# Patient Record
Sex: Female | Born: 1980 | Race: White | Hispanic: No | Marital: Married | Smoking: Never smoker
Health system: Southern US, Community
[De-identification: ages and names within clinical notes are randomized; demographics above are authoritative.]

## PROBLEM LIST (undated history)

## (undated) DIAGNOSIS — J45909 Unspecified asthma, uncomplicated: Secondary | ICD-10-CM

## (undated) HISTORY — PX: FOOT SURGERY: SHX648

---

## 2005-06-06 ENCOUNTER — Observation Stay: Payer: Self-pay | Admitting: Obstetrics and Gynecology

## 2005-06-07 ENCOUNTER — Ambulatory Visit: Payer: Self-pay | Admitting: Obstetrics and Gynecology

## 2005-06-08 ENCOUNTER — Ambulatory Visit: Payer: Self-pay | Admitting: Obstetrics and Gynecology

## 2005-06-20 ENCOUNTER — Observation Stay: Payer: Self-pay | Admitting: Obstetrics and Gynecology

## 2005-06-28 ENCOUNTER — Observation Stay: Payer: Self-pay | Admitting: Obstetrics and Gynecology

## 2005-07-01 ENCOUNTER — Inpatient Hospital Stay: Payer: Self-pay | Admitting: Obstetrics and Gynecology

## 2006-04-24 ENCOUNTER — Emergency Department: Payer: Self-pay | Admitting: Internal Medicine

## 2007-10-01 ENCOUNTER — Emergency Department: Payer: Self-pay | Admitting: Internal Medicine

## 2008-06-01 ENCOUNTER — Emergency Department: Payer: Self-pay | Admitting: Emergency Medicine

## 2009-01-20 ENCOUNTER — Ambulatory Visit: Payer: Self-pay | Admitting: Specialist

## 2009-02-06 ENCOUNTER — Ambulatory Visit: Payer: Self-pay | Admitting: Specialist

## 2009-04-08 ENCOUNTER — Ambulatory Visit: Payer: Self-pay | Admitting: Gastroenterology

## 2009-04-27 ENCOUNTER — Emergency Department: Payer: Self-pay | Admitting: Emergency Medicine

## 2009-07-29 ENCOUNTER — Emergency Department: Payer: Self-pay | Admitting: Emergency Medicine

## 2009-12-17 ENCOUNTER — Emergency Department: Payer: Self-pay | Admitting: Internal Medicine

## 2012-04-22 ENCOUNTER — Observation Stay: Payer: Self-pay | Admitting: Obstetrics and Gynecology

## 2012-04-22 LAB — URINALYSIS, COMPLETE
Bilirubin,UR: NEGATIVE
Blood: NEGATIVE
Glucose,UR: NEGATIVE mg/dL (ref 0–75)
Nitrite: NEGATIVE
Ph: 7 (ref 4.5–8.0)
Protein: NEGATIVE
RBC,UR: 1 /HPF (ref 0–5)
Specific Gravity: 1.012 (ref 1.003–1.030)
Squamous Epithelial: 3
WBC UR: 1 /HPF (ref 0–5)

## 2012-04-23 ENCOUNTER — Ambulatory Visit: Payer: Self-pay | Admitting: Obstetrics and Gynecology

## 2012-04-23 LAB — FETAL FIBRONECTIN
Appearance: NORMAL
Fetal Fibronectin: NEGATIVE

## 2012-04-24 ENCOUNTER — Observation Stay: Payer: Self-pay

## 2012-04-24 LAB — URINE CULTURE

## 2012-04-25 ENCOUNTER — Observation Stay: Payer: Self-pay | Admitting: Obstetrics and Gynecology

## 2012-05-13 ENCOUNTER — Observation Stay: Payer: Self-pay

## 2012-05-13 LAB — URINALYSIS, COMPLETE
Bilirubin,UR: NEGATIVE
Blood: NEGATIVE
Glucose,UR: NEGATIVE mg/dL (ref 0–75)
Ketone: NEGATIVE
Nitrite: NEGATIVE
Ph: 7 (ref 4.5–8.0)
Protein: NEGATIVE
RBC,UR: NONE SEEN /HPF (ref 0–5)
Specific Gravity: 1.013 (ref 1.003–1.030)
Squamous Epithelial: NONE SEEN
WBC UR: NONE SEEN /HPF (ref 0–5)

## 2012-05-15 LAB — URINE CULTURE

## 2012-05-31 ENCOUNTER — Observation Stay: Payer: Self-pay

## 2012-06-11 ENCOUNTER — Observation Stay: Payer: Self-pay

## 2012-06-12 ENCOUNTER — Observation Stay: Payer: Self-pay

## 2012-06-19 ENCOUNTER — Inpatient Hospital Stay: Payer: Self-pay | Admitting: Obstetrics and Gynecology

## 2012-06-19 LAB — CBC WITH DIFFERENTIAL/PLATELET
Basophil #: 0 10*3/uL (ref 0.0–0.1)
Basophil %: 0.4 %
Eosinophil #: 0.1 10*3/uL (ref 0.0–0.7)
Eosinophil %: 0.9 %
HCT: 40.2 % (ref 35.0–47.0)
HGB: 13.8 g/dL (ref 12.0–16.0)
Lymphocyte #: 2.2 10*3/uL (ref 1.0–3.6)
Lymphocyte %: 19.2 %
MCH: 31.3 pg (ref 26.0–34.0)
MCHC: 34.2 g/dL (ref 32.0–36.0)
MCV: 92 fL (ref 80–100)
Monocyte #: 0.9 x10 3/mm (ref 0.2–0.9)
Monocyte %: 8.1 %
Neutrophil #: 8.1 10*3/uL — ABNORMAL HIGH (ref 1.4–6.5)
Neutrophil %: 71.4 %
Platelet: 204 10*3/uL (ref 150–440)
RBC: 4.4 10*6/uL (ref 3.80–5.20)
RDW: 13.6 % (ref 11.5–14.5)
WBC: 11.3 10*3/uL — ABNORMAL HIGH (ref 3.6–11.0)

## 2012-06-20 LAB — HEMATOCRIT: HCT: 34.6 % — ABNORMAL LOW (ref 35.0–47.0)

## 2012-06-20 LAB — PATHOLOGY REPORT

## 2014-01-16 ENCOUNTER — Ambulatory Visit: Payer: Self-pay | Admitting: Obstetrics and Gynecology

## 2014-05-16 ENCOUNTER — Ambulatory Visit: Payer: Self-pay | Admitting: Podiatry

## 2015-04-20 NOTE — Op Note (Signed)
PATIENT NAME:  Alexis Woodard, Alexis Woodard MR#:  [id number] DATE OF BIRTH:  25-May-1981  DATE OF PROCEDURE:  06/19/2012  PREOPERATIVE DIAGNOSES:  1. A 37 + 5 weeks estimated gestational age in active labor.  2. Prior obstetrical trauma.  3. Elective permanent sterilization.   POSTOPERATIVE DIAGNOSIS:  1. A 37 + 5 weeks estimated gestational age in active labor.  2. Prior obstetrical trauma.  3. Elective permanent sterilization.   PROCEDURE:  1. Primary low transverse cesarean section, elective.  2. Bilateral tubal ligation.  3. On-Q pump placement.   ANESTHESIA: Spinal.   SURGEON: Suzy Bouchardhomas J. Schermerhorn, MD   FIRST ASSISTANT: Street, Scrub Tech   INDICATIONS: A 34 year old, gravida 2, para 1 at 537 + 5 weeks estimated gestational age in active labor 4 cm dilated. The patient had previously desired a primary cesarean section for a prior third-degree laceration and elective sterilization.   PROCEDURE: After adequate spinal anesthesia, the patient was placed in the dorsal supine position with a hip roll under the right side. The patient's abdomen was prepped and draped in normal sterile fashion. A Pfannenstiel incision was made two fingerbreadths above the symphysis pubis. Sharp dissection was used to identify the fascia. The fascia was opened in the midline and opened in a transverse fashion. The superior aspect of the fascia was grasped with Kocher clamps and the recti muscles dissected free. The inferior aspect of the fascia was grasped with Kocher clamps and the pyramidalis muscle was dissected free. Entry into the peritoneal cavity was accomplished sharply. The vesicouterine peritoneal fold was identified and opened and the bladder was reflected inferiorly. A low transverse uterine incision was made. Upon entry into the endometrial cavity, clear fluid resulted. The incision was extended with blunt transverse traction. The fetal head was brought to the incision, and head, shoulders and body were delivered  without difficulty, a vigorous female. The cord was doubly clamped and passed to Dr. Abundio MiuHorowitz, who assigned Apgar scores of 9 and 9. The placenta was manually delivered, and the uterus was exteriorized. The endometrial cavity was wiped clean, and the cervix was opened with a ring forceps and this passed off the operative field. The uterine incision was closed with 1 chromic suture in a running locking fashion with good approximation of edges. Good hemostasis was noted. The right fallopian tube was then grasped with a Babcock clamp in the midportion of the fallopian tube. Two separate 0 plain gut sutures were applied and a 1.5 cm portion of the fallopian tube removed. Good hemostasis was noted. A similar procedure was repeated on the patient's left fallopian tube after grasping the fallopian tube at the midportion with a Babcock clamp, and again two separate 0 plain gut sutures were applied and a 1.5 cm portion of the fallopian tube removed. Good hemostasis was noted. The posterior cul-de-sac was irrigated and suctioned, and the uterus was placed back into the abdominal cavity. The paracolic gutters were then wiped clean with a laparotomy tape. The tubal ligation sites appeared hemostatic bilaterally, and the uterine incision appeared hemostatic. Interceed was placed over the uterine incision in a Woodard-shaped fashion. The superior aspect of the fascia was grasped with Kocher clamps, and the On-Q pump catheters were placed subfascially in standard fashion. The fascia was then closed over top the On-Q pump catheters with 0 Vicryl in a running nonlocking fashion. Subcutaneous tissues were irrigated and bovied, and the skin was reapproximated with staples. The On-Q pump catheters were then loaded with 5 mL of 0.5% Marcaine  in each catheter. A sterile dressing was applied over the pump site catheters, and the procedure was terminated. Estimated blood loss was 400 mL. Intraoperative fluids were 1800 mL. Urine output was 200  mL. The patient tolerated the procedure and was taken to the recovery room in good condition.    ____________________________ Suzy Bouchard, MD tjs:cbb D: 06/19/2012 10:41:26 ET Woodard: 06/19/2012 10:55:21 ET JOB#: [id number]  cc: Suzy Bouchard, MD, <Dictator> Suzy Bouchard MD ELECTRONICALLY SIGNED 06/20/2012 9:19

## 2015-05-06 NOTE — H&P (Signed)
L&D Evaluation:  History:   HPI 34yo G2P1001 with LMp of 9/19/112 & EDd of 06/22/11 & L 6 with EDD of 07/04/12 here for NST due to PTL S/S seen on Sat and recommended to come here on Monday am for f/u/ Pt had UC's q 10 mins over the weekend and now 3 per hour. Cx was 60-70/closed/vtx high. No ROM, Vb or decreased FM.    Presents with NST    Patient's Medical History 3rd degree lac, PTL 34 weeks, GERD    Patient's Surgical History 3rd degree lac, Rt cyst removed    Medications Pre Natal Vitamins    Allergies NKDA    Social History none    Family History Non-Contributory   ROS:   ROS All systems were reviewed.  HEENT, CNS, GI, GU, Respiratory, CV, Renal and Musculoskeletal systems were found to be normal.   Exam:   Vital Signs stable    General no apparent distress    Mental Status clear    Chest clear    Heart normal sinus rhythm    Abdomen gravid, non-tender    Estimated Fetal Weight Average for gestational age    Back no CVAT    Edema 1+    Reflexes 1+    Clonus negative    Mebranes Intact    FHT normal rate with no decels, reactive NST    Skin dry    Lymph no lymphadenopathy   Impression:   Impression IUP at 29 weeks with Th PTL   Plan:   Plan DC home on pelvic rest, bedrest for 2 days if UC's continue    Follow Up Appointment in 1 week. May need a cx US to evaluate cx   Electronic Signatures: Sharee PimpleJones, Caron W (CNM)  (Signed 29-Apr-13 09:23)  Authored: L&D Evaluation   Last Updated: 29-Apr-13 09:23 by Sharee PimpleJones, Caron W (CNM)

## 2016-11-11 ENCOUNTER — Encounter: Payer: Self-pay | Admitting: Emergency Medicine

## 2016-11-11 ENCOUNTER — Emergency Department: Payer: BC Managed Care – PPO

## 2016-11-11 ENCOUNTER — Emergency Department
Admission: EM | Admit: 2016-11-11 | Discharge: 2016-11-11 | Disposition: A | Discharge status: Home / Self Care | Payer: BC Managed Care – PPO | Attending: Student in an Organized Health Care Education/Training Program | Admitting: Student in an Organized Health Care Education/Training Program

## 2016-11-11 DIAGNOSIS — Z79899 Other long term (current) drug therapy: Secondary | ICD-10-CM | POA: Insufficient documentation

## 2016-11-11 DIAGNOSIS — R51 Headache: Secondary | ICD-10-CM | POA: Insufficient documentation

## 2016-11-11 DIAGNOSIS — R519 Headache, unspecified: Secondary | ICD-10-CM

## 2016-11-11 LAB — CBC WITH DIFFERENTIAL/PLATELET
Basophils Absolute: 0 10*3/uL (ref 0–0.1)
Basophils Relative: 0 %
Eosinophils Absolute: 0.1 10*3/uL (ref 0–0.7)
Eosinophils Relative: 1 %
HCT: 44.3 % (ref 35.0–47.0)
Hemoglobin: 14.4 g/dL (ref 12.0–16.0)
Lymphocytes Relative: 13 %
Lymphs Abs: 1.4 10*3/uL (ref 1.0–3.6)
MCH: 29 pg (ref 26.0–34.0)
MCHC: 32.6 g/dL (ref 32.0–36.0)
MCV: 89.1 fL (ref 80.0–100.0)
Monocytes Absolute: 0.4 10*3/uL (ref 0.2–0.9)
Monocytes Relative: 4 %
Neutro Abs: 8.4 10*3/uL — ABNORMAL HIGH (ref 1.4–6.5)
Neutrophils Relative %: 82 %
Platelets: 266 10*3/uL (ref 150–440)
RBC: 4.97 MIL/uL (ref 3.80–5.20)
RDW: 13.4 % (ref 11.5–14.5)
WBC: 10.3 10*3/uL (ref 3.6–11.0)

## 2016-11-11 LAB — COMPREHENSIVE METABOLIC PANEL
ALT: 39 U/L (ref 14–54)
AST: 32 U/L (ref 15–41)
Albumin: 3.7 g/dL (ref 3.5–5.0)
Alkaline Phosphatase: 45 U/L (ref 38–126)
Anion gap: 7 (ref 5–15)
BUN: 9 mg/dL (ref 6–20)
CO2: 21 mmol/L — ABNORMAL LOW (ref 22–32)
Calcium: 8.4 mg/dL — ABNORMAL LOW (ref 8.9–10.3)
Chloride: 112 mmol/L — ABNORMAL HIGH (ref 101–111)
Creatinine, Ser: 0.75 mg/dL (ref 0.44–1.00)
GFR calc Af Amer: >60 mL/min (ref >60)
GFR calc non Af Amer: >60 mL/min (ref >60)
Glucose, Bld: 97 mg/dL (ref 65–99)
Potassium: 3.7 mmol/L (ref 3.5–5.1)
Sodium: 140 mmol/L (ref 135–145)
Total Bilirubin: 0.6 mg/dL (ref 0.3–1.2)
Total Protein: 6.7 g/dL (ref 6.5–8.1)

## 2016-11-11 MED ORDER — DEXAMETHASONE 4 MG PO TABS
6.0000 mg | ORAL_TABLET | Freq: Once | ORAL | Status: AC
  Administered 2016-11-11: 6 mg via ORAL
  Filled 2016-11-11: qty 2

## 2016-11-11 MED ORDER — PROCHLORPERAZINE EDISYLATE 5 MG/ML IJ SOLN
10.0000 mg | Freq: Once | INTRAMUSCULAR | Status: AC
  Administered 2016-11-11: 10 mg via INTRAVENOUS
  Filled 2016-11-11: qty 2

## 2016-11-11 MED ORDER — ACETAMINOPHEN 500 MG PO TABS
1000.0000 mg | ORAL_TABLET | Freq: Once | ORAL | Status: AC
  Administered 2016-11-11: 1000 mg via ORAL
  Filled 2016-11-11: qty 2

## 2016-11-11 MED ORDER — SODIUM CHLORIDE 0.9 % IV BOLUS (SEPSIS)
1000.0000 mL | Freq: Once | INTRAVENOUS | Status: AC
  Administered 2016-11-11: 1000 mL via INTRAVENOUS

## 2016-11-11 MED ORDER — DIPHENHYDRAMINE HCL 50 MG/ML IJ SOLN
25.0000 mg | Freq: Once | INTRAMUSCULAR | Status: AC
  Administered 2016-11-11: 25 mg via INTRAVENOUS
  Filled 2016-11-11: qty 1

## 2016-11-11 NOTE — ED Triage Notes (Signed)
Patient presents to the ED with the worst headache of her life to the left side of her head that began around 9am.  Patient states her vision was blurry as well for about 1 hour although that has now resolved.  Patient reports some dizziness with standing quickly or bending over.  Denies history of migraines.  Reports sinus congestion the past several days.

## 2016-11-11 NOTE — ED Provider Notes (Signed)
[facility] Emergency Department Provider Note    First MD Initiated Contact with Patient 11/11/16 1057     (approximate)  I have reviewed the triage vital signs and the nursing notes.   HISTORY  Chief Complaint Headache    HPI Alexis Woodard is a 35 y.o. female who presents with left-sided headache that started around 9 AM this morning.  Really rates the headache is 5 out of 10 in severity Patient denies any previous history of migraine headaches. States that she got up this morning started noticing of lack of vision in the right side of her visual fields that lasted roughly 2030 minutes. After that the resolved the patient developed left-sided headache. The blurry vision has resolved. She states that the headache has gradually increased over the past 2 hours. There is no sudden onset thunderclap headache. She denies any recent fevers. Has noted sinus congestion over the past several days. Denies any chance of being pregnant. Denies any other complaints at this time.   History reviewed. No pertinent past medical history. No family history on file. Past Surgical History:  Procedure Laterality Date  . CESAREAN SECTION    . FOOT SURGERY     There are no active problems to display for this patient.     Prior to Admission medications   Not on File    Allergies Patient has no known allergies.    Social History Social History  Substance Use Topics  . Smoking status: Never Smoker  . Smokeless tobacco: Never Used  . Alcohol use Yes     Comment: occasionally    Review of Systems Patient denies headaches, rhinorrhea, blurry vision, numbness, shortness of breath, chest pain, edema, cough, abdominal pain, nausea, vomiting, diarrhea, dysuria, fevers, rashes or hallucinations unless otherwise stated above in HPI. ____________________________________________   PHYSICAL EXAM:  VITAL SIGNS: Vitals:   11/11/16 1055  BP: 135/84  Pulse: 69  Resp: 18    Temp: 97.5 F (36.4 C)    Constitutional: Alert and oriented. in no acute distress. Eyes: Conjunctivae are normal. PERRL. EOMI. Head: Atraumatic. Nose: No congestion/rhinnorhea. Mouth/Throat: Mucous membranes are moist.  Oropharynx non-erythematous. Neck: No stridor. Painless ROM. No cervical spine tenderness to palpation Hematological/Lymphatic/Immunilogical: No cervical lymphadenopathy. Cardiovascular: Normal rate, regular rhythm. Grossly normal heart sounds.  Good peripheral circulation. Respiratory: Normal respiratory effort.  No retractions. Lungs CTAB. Gastrointestinal: Soft and nontender. No distention. No abdominal bruits. No CVA tenderness.  Musculoskeletal: No lower extremity tenderness nor edema.  No joint effusions. Neurologic:  CN- intact.  No facial droop, Normal FNF.  Normal heel to shin.  Sensation intact bilaterally. Normal speech and language. No gross focal neurologic deficits are appreciated. No gait instability.  Skin:  Skin is warm, dry and intact. No rash noted. Psychiatric: Mood and affect are normal. Speech and behavior are normal.  ____________________________________________   LABS (all labs ordered are listed, but only abnormal results are displayed)  No results found for this or any previous visit (from the past 24 hour(s)). ____________________________________________  EKG______________________________  RADIOLOGY  I personally reviewed all radiographic images ordered to evaluate for the above acute complaints and reviewed radiology reports and findings.  These findings were personally discussed with the patient.  Please see medical record for radiology report.  ____________________________________________   PROCEDURES  Procedure(s) performed: none Procedures    Critical Care performed: no ____________________________________________   INITIAL IMPRESSION / ASSESSMENT AND PLAN / ED COURSE  Pertinent labs & imaging results that were  available during my care of the patient were reviewed by me and considered in my medical decision making (see chart for details).  DDX: migraine, tension, bluster, glaucoma, cva, sah, meningitis  Alexis Woodard is a 35 y.o. who presents to the ED withHA for last  3 hours. Preceded by aura unilateral to left side. Gradual onset.  Denies focal neurologic symptoms at this time. Denies trauma. No fevers or neck pain. No vision loss. Afebrile in ED. VSS. Exam as above. No meningeal signs. No CN, motor, sensory or cerebellar deficits. Temporal arteries palpable and non-tender. Appears well and non-toxic.  Will provide IV fluids for hydration and IV medications for symptom control.  Will order CT head to eval for evidence of SAH or cva given no previous h/o headache.  The patient will be placed on continuous pulse oximetry and telemetry for monitoring.  Laboratory evaluation will be sent to evaluate for the above complaints.      Clinical Course as of Nov 12 1343  Thu Nov 11, 2016  1329 Patient reassessed and states her headache is completely resolved. Her repeat neuro exam is reassuring. I discussed the need to perform a lumbar puncture to fully evaluate and exclude any life-threatening headache exception is a subarachnoid or infectious process. As the patient is asymptomatic at this time with clinical history and symptoms consistent with migraine headache she has declined this with the plan for follow-up with neurologist and primary care physician.  [PR]    Clinical Course User Index [PR] Willy EddyPatrick Clare Casto, MD     Likely migraine HA. Clinical picture is not consistent with ICH, SAH, SDH, EDH, TIA, or CVA. No concern for meningitis or encephalitis. No concern for GCA/Temporal arteritis.  Pain improved. Repeat neuro exam is again without focal deficit, nuchal rigidity or evidence of meningeal irritation.  Stable to D/C home, follow up with PCP or Neurology if persistent recurrent Has.  Have discussed with  the patient and available family all diagnostics and treatments performed thus far and all questions were answered to the best of my ability. The patient demonstrates understanding and agreement with plan.   ____________________________________________   FINAL CLINICAL IMPRESSION(S) / ED DIAGNOSES  Final diagnoses:  Acute nonintractable headache, unspecified headache type      NEW MEDICATIONS STARTED DURING THIS VISIT:  New Prescriptions   No medications on file     Note:  This document was prepared using Dragon voice recognition software and may include unintentional dictation errors.    Willy EddyPatrick Tieshia Rettinger, MD 11/11/16 1344

## 2016-11-11 NOTE — Discharge Instructions (Signed)

## 2016-11-23 ENCOUNTER — Other Ambulatory Visit: Payer: Self-pay | Admitting: Neurology

## 2016-11-23 DIAGNOSIS — R519 Headache, unspecified: Secondary | ICD-10-CM

## 2016-11-23 DIAGNOSIS — R51 Headache: Principal | ICD-10-CM

## 2016-12-07 ENCOUNTER — Ambulatory Visit
Admission: RE | Admit: 2016-12-07 | Discharge: 2016-12-07 | Disposition: A | Discharge status: Home / Self Care | Payer: BC Managed Care – PPO | Source: Ambulatory Visit | Attending: Neurology | Admitting: Neurology

## 2016-12-07 DIAGNOSIS — R51 Headache: Secondary | ICD-10-CM | POA: Insufficient documentation

## 2016-12-07 DIAGNOSIS — R519 Headache, unspecified: Secondary | ICD-10-CM

## 2018-05-10 ENCOUNTER — Other Ambulatory Visit: Payer: Self-pay | Admitting: Obstetrics and Gynecology

## 2018-05-10 DIAGNOSIS — Z1231 Encounter for screening mammogram for malignant neoplasm of breast: Secondary | ICD-10-CM

## 2018-05-29 ENCOUNTER — Inpatient Hospital Stay: Admission: RE | Admit: 2018-05-29 | Payer: BC Managed Care – PPO | Source: Ambulatory Visit

## 2018-06-08 ENCOUNTER — Ambulatory Visit
Admission: RE | Admit: 2018-06-08 | Discharge: 2018-06-08 | Disposition: A | Discharge status: Home / Self Care | Payer: BC Managed Care – PPO | Source: Ambulatory Visit | Attending: Obstetrics and Gynecology | Admitting: Obstetrics and Gynecology

## 2018-06-08 DIAGNOSIS — Z1231 Encounter for screening mammogram for malignant neoplasm of breast: Secondary | ICD-10-CM | POA: Insufficient documentation

## 2018-10-02 ENCOUNTER — Other Ambulatory Visit
Admission: RE | Admit: 2018-10-02 | Discharge: 2018-10-02 | Disposition: A | Discharge status: Home / Self Care | Payer: BC Managed Care – PPO | Source: Ambulatory Visit | Attending: Internal Medicine | Admitting: Internal Medicine

## 2018-10-02 DIAGNOSIS — R079 Chest pain, unspecified: Secondary | ICD-10-CM | POA: Diagnosis not present

## 2018-10-02 LAB — FIBRIN DERIVATIVES D-DIMER (ARMC ONLY): Fibrin derivatives D-dimer (ARMC): 151.89 ng/mL (FEU) (ref 0.00–499.00)

## 2018-10-23 ENCOUNTER — Other Ambulatory Visit: Payer: Self-pay | Admitting: Pulmonary Disease

## 2018-10-23 DIAGNOSIS — J4540 Moderate persistent asthma, uncomplicated: Secondary | ICD-10-CM

## 2018-10-23 DIAGNOSIS — R0600 Dyspnea, unspecified: Secondary | ICD-10-CM

## 2018-10-23 DIAGNOSIS — R0609 Other forms of dyspnea: Secondary | ICD-10-CM

## 2018-11-14 ENCOUNTER — Ambulatory Visit: Payer: BC Managed Care – PPO | Attending: Pulmonary Disease

## 2018-11-14 DIAGNOSIS — R0609 Other forms of dyspnea: Secondary | ICD-10-CM | POA: Insufficient documentation

## 2018-11-14 DIAGNOSIS — J4540 Moderate persistent asthma, uncomplicated: Secondary | ICD-10-CM | POA: Insufficient documentation

## 2018-11-14 DIAGNOSIS — R0600 Dyspnea, unspecified: Secondary | ICD-10-CM

## 2018-11-14 MED ORDER — METHACHOLINE 0.0625 MG/ML NEB SOLN
2.0000 mL | Freq: Once | RESPIRATORY_TRACT | Status: AC
  Administered 2018-11-14: 0.125 mg via RESPIRATORY_TRACT
  Filled 2018-11-14: qty 2

## 2018-11-14 MED ORDER — METHACHOLINE 1 MG/ML NEB SOLN
2.0000 mL | Freq: Once | RESPIRATORY_TRACT | Status: AC
  Administered 2018-11-14: 2 mg via RESPIRATORY_TRACT
  Filled 2018-11-14: qty 2

## 2018-11-14 MED ORDER — SODIUM CHLORIDE 0.9 % IN NEBU
3.0000 mL | INHALATION_SOLUTION | Freq: Once | RESPIRATORY_TRACT | Status: AC
  Administered 2018-11-14: 3 mL via RESPIRATORY_TRACT

## 2018-11-14 MED ORDER — METHACHOLINE 0.25 MG/ML NEB SOLN
2.0000 mL | Freq: Once | RESPIRATORY_TRACT | Status: AC
  Administered 2018-11-14: 0.5 mg via RESPIRATORY_TRACT
  Filled 2018-11-14: qty 2

## 2018-11-14 MED ORDER — METHACHOLINE 16 MG/ML NEB SOLN
2.0000 mL | Freq: Once | RESPIRATORY_TRACT | Status: AC
  Filled 2018-11-14: qty 2

## 2018-11-14 MED ORDER — METHACHOLINE 4 MG/ML NEB SOLN
2.0000 mL | Freq: Once | RESPIRATORY_TRACT | Status: AC
  Administered 2018-11-14: 8 mg via RESPIRATORY_TRACT
  Filled 2018-11-14: qty 2

## 2018-11-14 MED ORDER — ALBUTEROL SULFATE (2.5 MG/3ML) 0.083% IN NEBU
2.5000 mg | INHALATION_SOLUTION | Freq: Once | RESPIRATORY_TRACT | Status: AC
  Administered 2018-11-14: 2.5 mg via RESPIRATORY_TRACT
  Filled 2018-11-14: qty 3

## 2020-03-08 ENCOUNTER — Ambulatory Visit: Payer: BC Managed Care – PPO | Attending: Internal Medicine

## 2020-03-08 DIAGNOSIS — Z23 Encounter for immunization: Secondary | ICD-10-CM

## 2020-03-08 NOTE — Progress Notes (Signed)
   Covid-19 Vaccination Clinic  Name:  Alexis Woodard    MRN: [id number] DOB: December 05, 1981  03/08/2020  [name] was observed post Covid-19 immunization for 30 minutes based on pre-vaccination screening without incident. She was provided with Vaccine Information Sheet and instruction to access the V-Safe system.   [name] was instructed to call 911 with any severe reactions post vaccine: Marland Kitchen Difficulty breathing  . Swelling of face and throat  . A fast heartbeat  . A bad rash all over body  . Dizziness and weakness   Immunizations Administered    Name Date Dose VIS Date Route   Pfizer COVID-19 Vaccine 03/08/2020 10:14 AM 0.3 mL 12/07/2019 Intramuscular   Manufacturer: [employer], [employer]   Lot: [id number]   NDC: [address]-7

## 2020-04-01 ENCOUNTER — Ambulatory Visit: Payer: BC Managed Care – PPO | Attending: Internal Medicine

## 2020-04-01 ENCOUNTER — Other Ambulatory Visit: Payer: Self-pay

## 2020-04-01 DIAGNOSIS — Z23 Encounter for immunization: Secondary | ICD-10-CM

## 2020-04-01 NOTE — Progress Notes (Signed)
   Covid-19 Vaccination Clinic  Name:  SHANTINA CHRONISTER    MRN: [id number] DOB: 1981/03/02  04/01/2020  Ms. Deming was observed post Covid-19 immunization for 30 minutes based on pre-vaccination screening without incident. She was provided with Vaccine Information Sheet and instruction to access the V-Safe system.   Ms. Margolis was instructed to call 911 with any severe reactions post vaccine: Marland Kitchen Difficulty breathing  . Swelling of face and throat  . A fast heartbeat  . A bad rash all over body  . Dizziness and weakness   Immunizations Administered    Name Date Dose VIS Date Route   Pfizer COVID-19 Vaccine 04/01/2020 12:05 PM 0.3 mL 12/07/2019 Intramuscular   Manufacturer: [employer], [employer]   Lot: [id number]   NDC: [address]-2

## 2021-02-04 ENCOUNTER — Other Ambulatory Visit
Admission: RE | Admit: 2021-02-04 | Discharge: 2021-02-04 | Disposition: A | Discharge status: Home / Self Care | Payer: BC Managed Care – PPO | Source: Ambulatory Visit | Attending: Pulmonary Disease | Admitting: Pulmonary Disease

## 2021-02-04 DIAGNOSIS — J4551 Severe persistent asthma with (acute) exacerbation: Secondary | ICD-10-CM | POA: Diagnosis not present

## 2021-02-04 LAB — FIBRIN DERIVATIVES D-DIMER (ARMC ONLY): Fibrin derivatives D-dimer (ARMC): 154.67 ng/mL (FEU) (ref 0.00–499.00)

## 2021-09-17 ENCOUNTER — Other Ambulatory Visit
Admission: RE | Admit: 2021-09-17 | Discharge: 2021-09-17 | Disposition: A | Discharge status: Home / Self Care | Payer: BC Managed Care – PPO | Source: Ambulatory Visit | Attending: Internal Medicine | Admitting: Internal Medicine

## 2021-09-17 DIAGNOSIS — R0789 Other chest pain: Secondary | ICD-10-CM | POA: Diagnosis not present

## 2021-09-17 DIAGNOSIS — R0602 Shortness of breath: Secondary | ICD-10-CM | POA: Insufficient documentation

## 2021-09-17 LAB — D-DIMER, QUANTITATIVE: D-Dimer, Quant: 0.39 ug/mL-FEU (ref 0.00–0.50)

## 2022-01-06 ENCOUNTER — Emergency Department: Payer: BC Managed Care – PPO

## 2022-01-06 ENCOUNTER — Emergency Department
Admission: EM | Admit: 2022-01-06 | Discharge: 2022-01-06 | Disposition: A | Discharge status: Home / Self Care | Payer: BC Managed Care – PPO | Attending: Emergency Medicine | Admitting: Emergency Medicine

## 2022-01-06 ENCOUNTER — Encounter: Payer: Self-pay | Admitting: Emergency Medicine

## 2022-01-06 ENCOUNTER — Other Ambulatory Visit: Payer: Self-pay

## 2022-01-06 DIAGNOSIS — J4541 Moderate persistent asthma with (acute) exacerbation: Secondary | ICD-10-CM | POA: Insufficient documentation

## 2022-01-06 DIAGNOSIS — R0602 Shortness of breath: Secondary | ICD-10-CM

## 2022-01-06 HISTORY — DX: Unspecified asthma, uncomplicated: J45.909

## 2022-01-06 LAB — BASIC METABOLIC PANEL
Anion gap: 9 (ref 5–15)
BUN: 12 mg/dL (ref 6–20)
CO2: 23 mmol/L (ref 22–32)
Calcium: 9.2 mg/dL (ref 8.9–10.3)
Chloride: 107 mmol/L (ref 98–111)
Creatinine, Ser: 0.77 mg/dL (ref 0.44–1.00)
GFR, Estimated: >60 mL/min (ref >60)
Glucose, Bld: 115 mg/dL — ABNORMAL HIGH (ref 70–99)
Potassium: 3.5 mmol/L (ref 3.5–5.1)
Sodium: 139 mmol/L (ref 135–145)

## 2022-01-06 LAB — CBC
HCT: 42.8 % (ref 36.0–46.0)
Hemoglobin: 14.1 g/dL (ref 12.0–15.0)
MCH: 29.3 pg (ref 26.0–34.0)
MCHC: 32.9 g/dL (ref 30.0–36.0)
MCV: 88.8 fL (ref 80.0–100.0)
Platelets: 336 10*3/uL (ref 150–400)
RBC: 4.82 MIL/uL (ref 3.87–5.11)
RDW: 12.7 % (ref 11.5–15.5)
WBC: 10.8 10*3/uL — ABNORMAL HIGH (ref 4.0–10.5)
nRBC: 0 % (ref 0.0–0.2)

## 2022-01-06 MED ORDER — IPRATROPIUM-ALBUTEROL 0.5-2.5 (3) MG/3ML IN SOLN
3.0000 mL | Freq: Once | RESPIRATORY_TRACT | Status: AC
  Administered 2022-01-06: 3 mL via RESPIRATORY_TRACT
  Filled 2022-01-06: qty 3

## 2022-01-06 MED ORDER — METHYLPREDNISOLONE SODIUM SUCC 125 MG IJ SOLR
125.0000 mg | Freq: Once | INTRAMUSCULAR | Status: AC
  Administered 2022-01-06: 125 mg via INTRAVENOUS
  Filled 2022-01-06: qty 2

## 2022-01-06 MED ORDER — ALBUTEROL SULFATE (2.5 MG/3ML) 0.083% IN NEBU
2.5000 mg | INHALATION_SOLUTION | Freq: Once | RESPIRATORY_TRACT | Status: AC
  Administered 2022-01-06: 2.5 mg via RESPIRATORY_TRACT
  Filled 2022-01-06: qty 3

## 2022-01-06 MED ORDER — PREDNISONE 50 MG PO TABS: 50.0000 mg | ORAL_TABLET | Freq: Every day | ORAL | 0 refills | Status: AC

## 2022-01-06 MED ORDER — MAGNESIUM SULFATE 2 GM/50ML IV SOLN
2.0000 g | Freq: Once | INTRAVENOUS | Status: AC
Start: 2022-01-06 — End: 2022-01-06
  Administered 2022-01-06: 2 g via INTRAVENOUS
  Filled 2022-01-06: qty 50

## 2022-01-06 NOTE — ED Triage Notes (Signed)
Pt comes into the ED via POV c/o SHOB and asthma attack.  Pt states she was seen at urgent care on Monday and they gave her steroid pack and antibiotics to take, but patient states it is only getting worse.  Pt has been using her albuterol neb treatments with no relief.  Pt's voice is hoarse at this time and patient is labored at rest.

## 2022-01-06 NOTE — ED Provider Notes (Signed)
[facility] Provider Note    Event Date/Time   First MD Initiated Contact with Patient 01/06/22 1048     (approximate)   History   Shortness of Breath and Asthma   HPI  Alexis Woodard is a 41 y.o. female with a history of asthma who presents with complaints of shortness of breath.  Patient reports she was has had worsening tightness in her chest with increasing shortness of breath over the last several days.  She has been using her nebulizers at home with little improvement.  She denies fevers or chills.  No nausea or vomiting.     Physical Exam   Triage Vital Signs: ED Triage Vitals [01/06/22 1033]  Enc Vitals Group     BP 133/88     Pulse Rate 76     Resp (!) 26     Temp 98.4 F (36.9 C)     Temp Source Oral     SpO2 99 %     Weight 74.8 kg (164 lb 14.5 oz)     Height 1.702 m (5\' 7" )     Head Circumference      Peak Flow      Pain Score 5     Pain Loc      Pain Edu?      Excl. in GC?     Most recent vital signs: Vitals:   01/06/22 1033 01/06/22 1426  BP: 133/88 130/80  Pulse: 76 78  Resp: (!) 26 18  Temp: 98.4 F (36.9 C)   SpO2: 99% 99%     General: Awake, no distress.  CV:  Good peripheral perfusion.  Resp:  Increased respiratory effort with tachypnea, no significant wheezing but poor airflow Abd:  No distention.  Other:  No calf pain or swelling   ED Results / Procedures / Treatments   Labs (all labs ordered are listed, but only abnormal results are displayed) Labs Reviewed  BASIC METABOLIC PANEL - Abnormal; Notable for the following components:      Result Value   Glucose, Bld 115 (*)    All other components within normal limits  CBC - Abnormal; Notable for the following components:   WBC 10.8 (*)    All other components within normal limits  POC URINE PREG, ED     EKG  ED ECG REPORT I, 03/06/22, the attending physician, personally viewed and interpreted this ECG.  Date: 01/06/2022  Rhythm: normal  sinus rhythm QRS Axis: normal Intervals: normal ST/T Wave abnormalities: normal Narrative Interpretation: no evidence of acute ischemia    RADIOLOGY Chest x-ray reviewed by me, no evidence of pneumonia    PROCEDURES:  Critical Care performed:   Procedures   MEDICATIONS ORDERED IN ED: Medications  albuterol (PROVENTIL) (2.5 MG/3ML) 0.083% nebulizer solution 2.5 mg (2.5 mg Nebulization Given 01/06/22 1043)  magnesium sulfate IVPB 2 g 50 mL (0 g Intravenous Stopped 01/06/22 1225)  methylPREDNISolone sodium succinate (SOLU-MEDROL) 125 mg/2 mL injection 125 mg (125 mg Intravenous Given 01/06/22 1121)  ipratropium-albuterol (DUONEB) 0.5-2.5 (3) MG/3ML nebulizer solution 3 mL (3 mLs Nebulization Given 01/06/22 1125)  ipratropium-albuterol (DUONEB) 0.5-2.5 (3) MG/3ML nebulizer solution 3 mL (3 mLs Nebulization Given 01/06/22 1125)  ipratropium-albuterol (DUONEB) 0.5-2.5 (3) MG/3ML nebulizer solution 3 mL (3 mLs Nebulization Given 01/06/22 1326)     IMPRESSION / MDM / ASSESSMENT AND PLAN / ED COURSE  I reviewed the triage vital signs and the nursing notes.  Patient presents with shortness of breath,  chest tightness as above, given her history of asthma this is consistent with an asthma exacerbation.  We will treat with IV magnesium, IV Solu-Medrol, multiple DuoNeb's given moderately severe exacerbation.  Lab work is reassuring, mild elevation of white blood cell count likely related to increased work of breathing, BMP is normal.  Glucose is normal  Chest x-ray without evidence of pneumonia.  Patient observed in the emergency department for several hours, gradual improvement noted on reexaminations  Discussed with patient admission however she reports she is feeling better and would be comfortable with discharge.  She notes that she will return if any worsening, her mother agrees with discharge at this time.  She has pulmonology follow-up in 1 day          FINAL CLINICAL  IMPRESSION(S) / ED DIAGNOSES   Final diagnoses:  Moderate persistent asthma with exacerbation     Rx / DC Orders   ED Discharge Orders          Ordered    predniSONE (DELTASONE) 50 MG tablet  Daily with breakfast        01/06/22 1405             Note:  This document was prepared using Dragon voice recognition software and may include unintentional dictation errors.   Jene Every, MD 01/06/22 1438

## 2022-01-07 ENCOUNTER — Other Ambulatory Visit: Payer: Self-pay | Admitting: Pulmonary Disease

## 2022-01-07 DIAGNOSIS — J4550 Severe persistent asthma, uncomplicated: Secondary | ICD-10-CM

## 2022-01-13 ENCOUNTER — Other Ambulatory Visit: Payer: Self-pay

## 2022-01-13 ENCOUNTER — Ambulatory Visit: Payer: BC Managed Care – PPO

## 2022-01-13 ENCOUNTER — Emergency Department: Payer: BC Managed Care – PPO

## 2022-01-13 ENCOUNTER — Emergency Department
Admission: EM | Admit: 2022-01-13 | Discharge: 2022-01-13 | Disposition: A | Discharge status: Home / Self Care | Payer: BC Managed Care – PPO | Attending: Emergency Medicine | Admitting: Emergency Medicine

## 2022-01-13 DIAGNOSIS — J4530 Mild persistent asthma, uncomplicated: Secondary | ICD-10-CM | POA: Diagnosis not present

## 2022-01-13 DIAGNOSIS — R0789 Other chest pain: Secondary | ICD-10-CM | POA: Diagnosis present

## 2022-01-13 DIAGNOSIS — J45998 Other asthma: Secondary | ICD-10-CM

## 2022-01-13 DIAGNOSIS — Z20822 Contact with and (suspected) exposure to covid-19: Secondary | ICD-10-CM | POA: Insufficient documentation

## 2022-01-13 DIAGNOSIS — R0602 Shortness of breath: Secondary | ICD-10-CM | POA: Diagnosis not present

## 2022-01-13 LAB — BASIC METABOLIC PANEL
Anion gap: 10 (ref 5–15)
BUN: 15 mg/dL (ref 6–20)
CO2: 21 mmol/L — ABNORMAL LOW (ref 22–32)
Calcium: 8.7 mg/dL — ABNORMAL LOW (ref 8.9–10.3)
Chloride: 103 mmol/L (ref 98–111)
Creatinine, Ser: 0.8 mg/dL (ref 0.44–1.00)
GFR, Estimated: >60 mL/min (ref >60)
Glucose, Bld: 107 mg/dL — ABNORMAL HIGH (ref 70–99)
Potassium: 2.8 mmol/L — ABNORMAL LOW (ref 3.5–5.1)
Sodium: 134 mmol/L — ABNORMAL LOW (ref 135–145)

## 2022-01-13 LAB — CBC
HCT: 43.1 % (ref 36.0–46.0)
Hemoglobin: 14.4 g/dL (ref 12.0–15.0)
MCH: 28.7 pg (ref 26.0–34.0)
MCHC: 33.4 g/dL (ref 30.0–36.0)
MCV: 86 fL (ref 80.0–100.0)
Platelets: 358 10*3/uL (ref 150–400)
RBC: 5.01 MIL/uL (ref 3.87–5.11)
RDW: 12.3 % (ref 11.5–15.5)
WBC: 9.8 10*3/uL (ref 4.0–10.5)
nRBC: 0 % (ref 0.0–0.2)

## 2022-01-13 LAB — RESP PANEL BY RT-PCR (FLU A&B, COVID) ARPGX2
Influenza A by PCR: NEGATIVE
Influenza B by PCR: NEGATIVE
SARS Coronavirus 2 by RT PCR: NEGATIVE

## 2022-01-13 MED ORDER — ONDANSETRON HCL 4 MG/2ML IJ SOLN
INTRAMUSCULAR | Status: AC
  Administered 2022-01-13: 4 mg via INTRAVENOUS
  Filled 2022-01-13: qty 2

## 2022-01-13 MED ORDER — PREDNISONE 10 MG PO TABS: ORAL_TABLET | ORAL | 0 refills | Status: AC

## 2022-01-13 MED ORDER — IOHEXOL 350 MG/ML SOLN
75.0000 mL | Freq: Once | INTRAVENOUS | Status: AC | PRN
  Administered 2022-01-13: 75 mL via INTRAVENOUS

## 2022-01-13 MED ORDER — ONDANSETRON HCL 4 MG/2ML IJ SOLN: 4.0000 mg | Freq: Once | INTRAMUSCULAR | Status: AC

## 2022-01-13 MED ORDER — ALBUTEROL SULFATE (2.5 MG/3ML) 0.083% IN NEBU: 3.0000 mL | INHALATION_SOLUTION | RESPIRATORY_TRACT | Status: DC | PRN

## 2022-01-13 NOTE — ED Provider Notes (Signed)
[facility] Provider Note    Event Date/Time   First MD Initiated Contact with Patient 01/13/22 0813     (approximate)   History   Shortness of Breath   HPI  Alexis Woodard is a 41 y.o. female with a past history of asthma who comes ED complaining of chest tightness and shortness of breath for the past 10 days.  Increasing chest discomfort which is pleuritic.  She also has some cough.  No fever.  Symptoms are constant, not alleviated by frequent use of bronchodilators at home.  EMR reviewed including pulmonology clinic visit note from 6 days ago.  Noting that patient has a history of asthma which has been difficult to control, requiring frequent use of steroids.  She has tried immunomodulatory therapy as well and biologic therapy.  Pulmonology plans to obtain a CT angiogram to rule out pulmonary embolism.       Physical Exam   Triage Vital Signs: ED Triage Vitals  Enc Vitals Group     BP 01/13/22 0803 120/88     Pulse Rate 01/13/22 0803 81     Resp 01/13/22 0803 (!) 22     Temp 01/13/22 0803 (!) 97.5 F (36.4 C)     Temp src --      SpO2 01/13/22 0803 99 %     Weight --      Height --      Head Circumference --      Peak Flow --      Pain Score 01/13/22 0802 5     Pain Loc --      Pain Edu? --      Excl. in GC? --     Most recent vital signs: Vitals:   01/13/22 1000 01/13/22 1114  BP: 120/86 119/62  Pulse: 65 67  Resp: 19 18  Temp:    SpO2: 98% 99%     General: Awake, no distress.  CV:  Good peripheral perfusion.  Regular rate and rhythm.  Normal pulses Resp:  Normal effort.  Mild tachypnea.  No significant wheezing or stridor.  No inducible wheezing or cough with FEV1 maneuver Abd:  No distention.  Soft, nontender Other:  No lower extremity edema or tenderness   ED Results / Procedures / Treatments   Labs (all labs ordered are listed, but only abnormal results are displayed) Labs Reviewed  BASIC METABOLIC PANEL - Abnormal;  Notable for the following components:      Result Value   Sodium 134 (*)    Potassium 2.8 (*)    CO2 21 (*)    Glucose, Bld 107 (*)    Calcium 8.7 (*)    All other components within normal limits  RESP PANEL BY RT-PCR (FLU A&B, COVID) ARPGX2  CBC     EKG  Interpreted by me Sinus rhythm, rate of 71.  Normal axis and intervals.  Normal QRS ST segments and T waves.   RADIOLOGY Chest x-ray viewed and interpreted by me, appears unremarkable.  No pneumothorax or pneumonia.  Radiology report reviewed.    PROCEDURES:  Critical Care performed: No  .1-3 Lead EKG Interpretation Performed by: Sharman Cheek, MD Authorized by: Sharman Cheek, MD     Interpretation: normal     ECG rate:  70   ECG rate assessment: normal     Rhythm: sinus rhythm     Ectopy: none     Conduction: normal     MEDICATIONS ORDERED IN ED: Medications  albuterol (PROVENTIL) (2.5 MG/3ML) 0.083% nebulizer solution 3 mL (has no administration in time range)  ondansetron (ZOFRAN) injection 4 mg (4 mg Intravenous Given 01/13/22 0839)  iohexol (OMNIPAQUE) 350 MG/ML injection 75 mL (75 mLs Intravenous Contrast Given 01/13/22 1035)     IMPRESSION / MDM / ASSESSMENT AND PLAN / ED COURSE  I reviewed the triage vital signs and the nursing notes.                              Differential diagnosis includes, but is not limited to, asthma exacerbation, pneumonia, bronchitis, pneumothorax, pulmonary embolism, GERD  The patient is on the cardiac monitor to evaluate for evidence of arrhythmia and/or significant heart rate changes.  Patient presents with persistent shortness of breath for more than a week, not resolved with high-dose steroid therapy and frequent bronchodilator use.  Vital signs show mild tachypnea.  Chest x-ray unremarkable today.  Labs overall unremarkable except for hypokalemia due to beta agonist.  We will need to obtain COVID/flu swab and CT angiogram  chest.  ----------------------------------------- 11:36 AM on 01/13/2022 ----------------------------------------- COVID and flu negative.  CT angiogram negative for acute findings, no PE or infiltrates.  Images viewed by me, radiology report reviewed.  Patient does report feeling somewhat improved after bronchodilator here in the ED.  Oxygenation is 100% on room air.  Work of breathing is normal.  I think she is suitable for discharge home and continued outpatient follow-up with pulmonology at this point.  I will provide a new prescription for a long prednisone taper.  Return precautions discussed.      FINAL CLINICAL IMPRESSION(S) / ED DIAGNOSES   Final diagnoses:  SOB (shortness of breath)  Asthma, persistent not controlled     Rx / DC Orders   ED Discharge Orders          Ordered    predniSONE (DELTASONE) 10 MG tablet        01/13/22 1136             Note:  This document was prepared using Dragon voice recognition software and may include unintentional dictation errors.   Sharman Cheek, MD 01/13/22 1137

## 2022-01-13 NOTE — Discharge Instructions (Signed)
Your lab test and CT scan of the chest were all okay today.  Please continue using your medications as needed for symptom relief and follow-up with pulmonology.

## 2022-01-13 NOTE — ED Triage Notes (Signed)
Pt comes with c/o increased SOb. Pt states her chest now hurts. Pt states this started over week ago and has progressed. Pt did take steroid pack and antibiotics.

## 2022-01-21 ENCOUNTER — Other Ambulatory Visit: Payer: Self-pay | Admitting: Pulmonary Disease

## 2022-01-21 DIAGNOSIS — R0602 Shortness of breath: Secondary | ICD-10-CM

## 2022-01-28 ENCOUNTER — Ambulatory Visit
Admission: RE | Admit: 2022-01-28 | Discharge: 2022-01-28 | Disposition: A | Discharge status: Home / Self Care | Payer: BC Managed Care – PPO | Source: Ambulatory Visit | Attending: Pulmonary Disease | Admitting: Pulmonary Disease

## 2022-01-28 ENCOUNTER — Other Ambulatory Visit: Payer: Self-pay

## 2022-01-28 DIAGNOSIS — J4550 Severe persistent asthma, uncomplicated: Secondary | ICD-10-CM | POA: Insufficient documentation

## 2022-02-04 ENCOUNTER — Ambulatory Visit
Admission: RE | Admit: 2022-02-04 | Discharge: 2022-02-04 | Disposition: A | Discharge status: Home / Self Care | Payer: BC Managed Care – PPO | Source: Ambulatory Visit | Attending: Pulmonary Disease | Admitting: Pulmonary Disease

## 2022-02-04 ENCOUNTER — Other Ambulatory Visit: Payer: Self-pay

## 2022-02-04 DIAGNOSIS — R0602 Shortness of breath: Secondary | ICD-10-CM | POA: Diagnosis present

## 2022-11-10 ENCOUNTER — Other Ambulatory Visit: Payer: Self-pay | Admitting: General Surgery

## 2022-11-10 DIAGNOSIS — R2232 Localized swelling, mass and lump, left upper limb: Secondary | ICD-10-CM

## 2022-11-16 ENCOUNTER — Ambulatory Visit
Admission: RE | Admit: 2022-11-16 | Discharge: 2022-11-16 | Disposition: A | Discharge status: Home / Self Care | Payer: Federal, State, Local not specified - PPO | Source: Ambulatory Visit | Attending: General Surgery | Admitting: General Surgery

## 2022-11-16 ENCOUNTER — Other Ambulatory Visit: Payer: Self-pay | Admitting: General Surgery

## 2022-11-16 DIAGNOSIS — R2232 Localized swelling, mass and lump, left upper limb: Secondary | ICD-10-CM

## 2023-02-15 ENCOUNTER — Inpatient Hospital Stay: Payer: Federal, State, Local not specified - PPO

## 2023-02-15 ENCOUNTER — Inpatient Hospital Stay: Payer: Federal, State, Local not specified - PPO | Admitting: Licensed Clinical Social Worker

## 2023-03-17 ENCOUNTER — Inpatient Hospital Stay: Payer: Federal, State, Local not specified - PPO

## 2023-03-17 ENCOUNTER — Inpatient Hospital Stay: Payer: Federal, State, Local not specified - PPO | Admitting: Licensed Clinical Social Worker

## 2023-04-05 ENCOUNTER — Encounter: Payer: Self-pay | Admitting: Licensed Clinical Social Worker

## 2023-04-05 ENCOUNTER — Inpatient Hospital Stay: Payer: Federal, State, Local not specified - PPO | Attending: Oncology | Admitting: Licensed Clinical Social Worker

## 2023-04-05 ENCOUNTER — Inpatient Hospital Stay: Payer: Federal, State, Local not specified - PPO

## 2023-04-05 DIAGNOSIS — Z800 Family history of malignant neoplasm of digestive organs: Secondary | ICD-10-CM

## 2023-04-05 DIAGNOSIS — Z808 Family history of malignant neoplasm of other organs or systems: Secondary | ICD-10-CM | POA: Diagnosis not present

## 2023-04-05 DIAGNOSIS — Z803 Family history of malignant neoplasm of breast: Secondary | ICD-10-CM

## 2023-04-05 DIAGNOSIS — Z807 Family history of other malignant neoplasms of lymphoid, hematopoietic and related tissues: Secondary | ICD-10-CM

## 2023-04-05 NOTE — Progress Notes (Signed)
REFERRING PROVIDER: Schermerhorn, Ihor Austin, MD [address] [facility] [place],  [place] [address]  PRIMARY PROVIDER:  Schermerhorn, Ihor Austin, MD  PRIMARY REASON FOR VISIT:  1. Family history of breast cancer   2. Family history of colon cancer   3. Family history of lymphoma   4. Family history of thyroid cancer      HISTORY OF PRESENT ILLNESS:   Alexis Woodard, a 42 y.o. female, was seen for a [place] cancer genetics consultation at the request of Dr. Feliberto Gottron due to a family history of cancer.  Alexis Woodard presents to clinic today to discuss the possibility of a hereditary predisposition to cancer, genetic testing, and to further clarify her future cancer risks, as well as potential cancer risks for family members.   CANCER HISTORY:  Alexis Woodard is a 42 y.o. female with no personal history of cancer.    RISK FACTORS:  Menarche was at age 62.  First live birth at age 56.  Ovaries intact: yes.  Hysterectomy: no.  Menopausal status: premenopausal.  Colonoscopy: scheduled for this Thursday. Mammogram within the last year: scheduled. Number of breast biopsies: 0. Up to date with pelvic exams: yes.  Past Medical History:  Diagnosis Date   Asthma     Past Surgical History:  Procedure Laterality Date   CESAREAN SECTION     FOOT SURGERY      FAMILY HISTORY:  We obtained a detailed, 4-generation family history.  Significant diagnoses are listed below: Family History  Problem Relation Age of Onset   Hodgkin's lymphoma Father 67   Breast cancer Maternal Grandmother 42   Thyroid cancer Maternal Grandmother        dx 34s   Cervical cancer Maternal Grandmother    Colon cancer Paternal Grandmother        dx >50   Breast cancer Other        dx >50   Alexis Woodard has 1 son, 82, and 1 daughter, 74. She has 1 brother, 1 sister, no cancers.  Alexis Woodard mother is living at 30 and has not had genetic testing. Patient has 3 maternal aunts, no cancers.  Maternal grandmother had breast cancer at 68, cervical cancer, and thyroid cancer in her 50s, and passed at 56. Her brother, patient's great uncle, had breast cancer and liver cancer and died over age 35.  Alexis Woodard father had Hodgkin's lymphoma at 78 and is living at 37. Patient's paternal uncle had unknown type of cancer at 61 and died af 65. Paternal grandmother had colon cancer over age 73.   Alexis Woodard is unaware of previous family history of genetic testing for hereditary cancer risks. There is no reported Ashkenazi Jewish ancestry. There is no known consanguinity.    GENETIC COUNSELING ASSESSMENT: Alexis Woodard is a 42 y.o. female with a family history of breast cancer which is somewhat suggestive of a hereditary cancer syndrome and predisposition to cancer. We, therefore, discussed and recommended the following at today's visit.   DISCUSSION: We discussed that approximately 10% of breast cancer is hereditary. Most cases of hereditary breast cancer are associated with BRCA1/BRCA2 genes, although there are other genes associated with hereditary cancer as well. Cancers and risks are gene specific. We discussed that testing is beneficial for several reasons including knowing about cancer risks, identifying potential screening and risk-reduction options that may be appropriate, and to understand if other family members could be at risk for cancer and allow them to undergo genetic testing.  We reviewed the characteristics, features and inheritance patterns of hereditary cancer syndromes. We also discussed genetic testing, including the appropriate family members to test, the process of testing, insurance coverage and turn-around-time for results. We discussed the implications of a negative, positive and/or variant of uncertain significant result. We recommended Alexis Woodard pursue genetic testing for the [facility] Multi-Cancer+RNA gene panel.   Based on Alexis Woodard's family history of cancer, she meets  medical criteria for genetic testing. Despite that she meets criteria, she may still have an out of pocket cost. We discussed that if her out of pocket cost for testing is over $100, the laboratory will call and confirm whether she wants to proceed with testing.  If the out of pocket cost of testing is less than $100 she will be billed by the genetic testing laboratory.   We discussed that some people do not want to undergo genetic testing due to fear of genetic discrimination.  A federal law called the Genetic Information Non-Discrimination Act (GINA) of 2008 helps protect individuals against genetic discrimination based on their genetic test results.  It impacts both health insurance and employment.  For health insurance, it protects against increased premiums, being kicked off insurance or being forced to take a test in order to be insured.  For employment it protects against hiring, firing and promoting decisions based on genetic test results.  Health status due to a cancer diagnosis is not protected under GINA.  This law does not protect life insurance, disability insurance, or other types of insurance.   PLAN: After considering the risks, benefits, and limitations, Alexis Woodard provided informed consent to pursue genetic testing and the blood sample was sent to [facility] for analysis of the Multi-Cancer+RNA panel. Results should be available within approximately 2-3 weeks' time, at which point they will be disclosed by telephone to Alexis Woodard, as will any additional recommendations warranted by these results. Alexis Woodard will receive a summary of her genetic counseling visit and a copy of her results once available. This information will also be available in Epic.   Alexis Woodard questions were answered to her satisfaction today. Our contact information was provided should additional questions or concerns arise. Thank you for the referral and allowing [place] to share in the care of your patient.    Lacy Duverney, MS, [facility] Genetic Counselor [email or web address] Phone: [phone]  The patient was seen for a total of 25 minutes in face-to-face genetic counseling.  Dr. Orlie Dakin was available for discussion regarding this case.   _______________________________________________________________________ For Office Staff:  Number of people involved in session: 1 Was an Intern/ student involved with case: no

## 2023-04-07 ENCOUNTER — Ambulatory Visit: Payer: Federal, State, Local not specified - PPO

## 2023-04-07 DIAGNOSIS — D122 Benign neoplasm of ascending colon: Secondary | ICD-10-CM

## 2023-04-07 DIAGNOSIS — Z1211 Encounter for screening for malignant neoplasm of colon: Secondary | ICD-10-CM

## 2023-04-07 DIAGNOSIS — Z83719 Family history of colon polyps, unspecified: Secondary | ICD-10-CM

## 2023-04-13 ENCOUNTER — Telehealth: Payer: Self-pay | Admitting: Licensed Clinical Social Worker

## 2023-04-18 ENCOUNTER — Encounter: Payer: Self-pay | Admitting: Licensed Clinical Social Worker

## 2023-04-18 ENCOUNTER — Ambulatory Visit: Payer: Self-pay | Admitting: Licensed Clinical Social Worker

## 2023-04-18 DIAGNOSIS — Z1379 Encounter for other screening for genetic and chromosomal anomalies: Secondary | ICD-10-CM | POA: Insufficient documentation

## 2023-04-18 NOTE — Telephone Encounter (Signed)
I contacted Ms. Guirguis to discuss her genetic testing results. No pathogenic variants were identified in the 70 genes analyzed. Detailed clinic note to follow.   The test report has been scanned into EPIC and is located under the Molecular Pathology section of the Results Review tab.  A portion of the result report is included below for reference.      Lacy Duverney, MS, [facility] Genetic Counselor [email or web address] Phone: [phone]

## 2023-04-18 NOTE — Progress Notes (Signed)
HPI:   Ms. Yono was previously seen in the [facility] clinic due to a family history of cancer and concerns regarding a hereditary predisposition to cancer. Please refer to our prior cancer genetics clinic note for more information regarding our discussion, assessment and recommendations, at the time. Ms. Bracknell recent genetic test results were disclosed to her, as were recommendations warranted by these results. These results and recommendations are discussed in more detail below.  CANCER HISTORY:  Oncology History   No history exists.    FAMILY HISTORY:  We obtained a detailed, 4-generation family history.  Significant diagnoses are listed below: Family History  Problem Relation Age of Onset   Hodgkin's lymphoma Father 84   Breast cancer Maternal Grandmother 64   Thyroid cancer Maternal Grandmother        dx 44s   Cervical cancer Maternal Grandmother    Colon cancer Paternal Grandmother        dx >50   Breast cancer Other        dx >50   Ms. Friel has 1 son, 19, and 1 daughter, 36. She has 1 brother, 1 sister, no cancers.   Ms. Egger mother is living at 42 and has not had genetic testing. Patient has 3 maternal aunts, no cancers. Maternal grandmother had breast cancer at 60, cervical cancer, and thyroid cancer in her 81s, and passed at 68. Her brother, patient's great uncle, had breast cancer and liver cancer and died over age 21.   Ms. Gunnarson father had Hodgkin's lymphoma at 78 and is living at 60. Patient's paternal uncle had unknown type of cancer at 55 and died af 51. Paternal grandmother had colon cancer over age 37.    Ms. Wethington is unaware of previous family history of genetic testing for hereditary cancer risks. There is no reported Ashkenazi Jewish ancestry. There is no known consanguinity.      GENETIC TEST RESULTS:  The [facility] Multi-Cancer+RNA Panel found no pathogenic mutations.   The Multi-Cancer + RNA Panel offered by [facility] includes  sequencing and/or deletion/duplication analysis of the following 70 genes:  AIP*, ALK, APC*, ATM*, AXIN2*, BAP1*, BARD1*, BLM*, BMPR1A*, BRCA1*, BRCA2*, BRIP1*, CDC73*, CDH1*, CDK4, CDKN1B*, CDKN2A, CHEK2*, CTNNA1*, DICER1*, EPCAM, EGFR, FH*, FLCN*, GREM1, HOXB13, KIT, LZTR1, MAX*, MBD4, MEN1*, MET, MITF, MLH1*, MSH2*, MSH3*, MSH6*, MUTYH*, NF1*, NF2*, NTHL1*, PALB2*, PDGFRA, PMS2*, POLD1*, POLE*, POT1*, PRKAR1A*, PTCH1*, PTEN*, RAD51C*, RAD51D*, RB1*, RET, SDHA*, SDHAF2*, SDHB*, SDHC*, SDHD*, SMAD4*, SMARCA4*, SMARCB1*, SMARCE1*, STK11*, SUFU*, TMEM127*, TP53*, TSC1*, TSC2*, VHL*. RNA analysis is performed for * genes.   The test report has been scanned into EPIC and is located under the Molecular Pathology section of the Results Review tab.  A portion of the result report is included below for reference. Genetic testing reported out on 04/13/2023.     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  At this time, it is unknown if this variant is associated with an increased risk for cancer or if it is benign, but most uncertain variants are reclassified to benign. It should not be used to make medical management decisions. With time, we suspect the laboratory will determine the significance of this variant, if any. If the laboratory reclassifies this variant, we will attempt to contact Ms. Kawa to discuss it further.   Even though a pathogenic variant was not identified, possible explanations for the cancer in the family may include: There may be no hereditary risk for  cancer in the family. The cancers in Ms. Rideout and/or her family may be sporadic/familial or due to other genetic and environmental factors. There may be a gene mutation in one of these genes that current testing methods cannot detect but that chance is small. There could be another gene that has not yet been discovered, or that we have not yet tested, that is responsible for the  cancer diagnoses in the family.  It is also possible there is a hereditary cause for the cancer in the family that Ms. Blatter did not inherit.  Therefore, it is important to remain in touch with cancer genetics in the future so that we can continue to offer Ms. Miles the most up to date genetic testing.   ADDITIONAL GENETIC TESTING:  We discussed with Ms. Budnick that her genetic testing was fairly extensive.  If there are additional relevant genes identified to increase cancer risk that can be analyzed in the future, we would be happy to discuss and coordinate this testing at that time.    CANCER SCREENING RECOMMENDATIONS:  Ms. Stehr test result is considered negative (normal).  This means that we have not identified a hereditary cause for her family history of cancer at this time.   An individual's cancer risk and medical management are not determined by genetic test results alone. Overall cancer risk assessment incorporates additional factors, including personal medical history, family history, and any available genetic information that may result in a personalized plan for cancer prevention and surveillance. Therefore, it is recommended she continue to follow the cancer management and screening guidelines provided by her primary healthcare provider.  Based on Ms. Umland's personal and family history of cancer as well as her genetic test results, risk model Rockne Menghini was used to estimate her risk of developing breast cancer. This estimates her lifetime risk of developing breast cancer to be approximately 12%.  The patient's lifetime breast cancer risk is a preliminary estimate based on available information using one of several models endorsed by the [employer] (NCCN). The NCCN recommends consideration of breast MRI screening as an adjunct to mammography for patients at high risk (defined as 20% or greater lifetime risk).  This risk estimate can change over time and may be  repeated to reflect new information in her personal or family history in the future.    RECOMMENDATIONS FOR FAMILY MEMBERS:   Since she did not inherit a identifiable mutation in a cancer predisposition gene included on this panel, her children could not have inherited a known mutation from her in one of these genes. Individuals in this family might be at some increased risk of developing cancer, over the general population risk, due to the family history of cancer.  Individuals in the family should notify their providers of the family history of cancer. We recommend women in this family have a yearly mammogram beginning at age 81, or 10 years younger than the earliest onset of cancer, an annual clinical breast exam, and perform monthly breast self-exams.  Family members should have colonoscopies by at age 51, or earlier, as recommended by their providers. Other members of the family may still carry a pathogenic variant in one of these genes that Ms. Kamphuis did not inherit. Based on the family history, we recommend her mother/maternal relatives have genetic counseling and testing. Ms. Heinle will let us know if we can be of any assistance in coordinating genetic counseling and/or testing for this family member.   We do  not recommend familial testing for the PTCH1 variant of uncertain significance (VUS).  FOLLOW-UP:  Lastly, we discussed with Ms. Iams that cancer genetics is a rapidly advancing field and it is possible that new genetic tests will be appropriate for her and/or her family members in the future. We encouraged her to remain in contact with cancer genetics on an annual basis so we can update her personal and family histories and let her know of advances in cancer genetics that may benefit this family.   Our contact number was provided. Ms. Topp questions were answered to her satisfaction, and she knows she is welcome to call us at anytime with additional questions or concerns.    Lacy Duverney, MS, [facility] Genetic Counselor [email or web address] Phone: [phone]

## 2023-07-09 ENCOUNTER — Other Ambulatory Visit: Payer: Self-pay

## 2023-07-09 ENCOUNTER — Emergency Department: Payer: Federal, State, Local not specified - PPO

## 2023-07-09 ENCOUNTER — Encounter: Payer: Self-pay | Admitting: Radiology

## 2023-07-09 ENCOUNTER — Emergency Department
Admission: EM | Admit: 2023-07-09 | Discharge: 2023-07-09 | Disposition: A | Discharge status: Home / Self Care | Payer: Federal, State, Local not specified - PPO | Attending: Emergency Medicine | Admitting: Emergency Medicine

## 2023-07-09 DIAGNOSIS — R1030 Lower abdominal pain, unspecified: Secondary | ICD-10-CM | POA: Insufficient documentation

## 2023-07-09 DIAGNOSIS — R109 Unspecified abdominal pain: Secondary | ICD-10-CM

## 2023-07-09 DIAGNOSIS — D72829 Elevated white blood cell count, unspecified: Secondary | ICD-10-CM | POA: Diagnosis not present

## 2023-07-09 DIAGNOSIS — R112 Nausea with vomiting, unspecified: Secondary | ICD-10-CM | POA: Diagnosis not present

## 2023-07-09 DIAGNOSIS — K529 Noninfective gastroenteritis and colitis, unspecified: Secondary | ICD-10-CM

## 2023-07-09 LAB — URINALYSIS, ROUTINE W REFLEX MICROSCOPIC
Bilirubin Urine: NEGATIVE
Glucose, UA: NEGATIVE mg/dL
Hgb urine dipstick: NEGATIVE
Ketones, ur: NEGATIVE mg/dL
Nitrite: NEGATIVE
Protein, ur: NEGATIVE mg/dL
Specific Gravity, Urine: 1.023 (ref 1.005–1.030)
pH: 5 (ref 5.0–8.0)

## 2023-07-09 LAB — CBC
HCT: 45.9 % (ref 36.0–46.0)
Hemoglobin: 15.2 g/dL — ABNORMAL HIGH (ref 12.0–15.0)
MCH: 29.3 pg (ref 26.0–34.0)
MCHC: 33.1 g/dL (ref 30.0–36.0)
MCV: 88.6 fL (ref 80.0–100.0)
Platelets: 348 10*3/uL (ref 150–400)
RBC: 5.18 MIL/uL — ABNORMAL HIGH (ref 3.87–5.11)
RDW: 12.5 % (ref 11.5–15.5)
WBC: 13.8 10*3/uL — ABNORMAL HIGH (ref 4.0–10.5)
nRBC: 0 % (ref 0.0–0.2)

## 2023-07-09 LAB — COMPREHENSIVE METABOLIC PANEL
ALT: 107 U/L — ABNORMAL HIGH (ref 0–44)
AST: 62 U/L — ABNORMAL HIGH (ref 15–41)
Albumin: 4.5 g/dL (ref 3.5–5.0)
Alkaline Phosphatase: 80 U/L (ref 38–126)
Anion gap: 9 (ref 5–15)
BUN: 13 mg/dL (ref 6–20)
CO2: 20 mmol/L — ABNORMAL LOW (ref 22–32)
Calcium: 9.1 mg/dL (ref 8.9–10.3)
Chloride: 103 mmol/L (ref 98–111)
Creatinine, Ser: 0.76 mg/dL (ref 0.44–1.00)
GFR, Estimated: >60 mL/min (ref >60)
Glucose, Bld: 162 mg/dL — ABNORMAL HIGH (ref 70–99)
Potassium: 3.7 mmol/L (ref 3.5–5.1)
Sodium: 132 mmol/L — ABNORMAL LOW (ref 135–145)
Total Bilirubin: 0.8 mg/dL (ref 0.3–1.2)
Total Protein: 7.9 g/dL (ref 6.5–8.1)

## 2023-07-09 LAB — LIPASE, BLOOD: Lipase: 48 U/L (ref 11–51)

## 2023-07-09 LAB — PREGNANCY, URINE: Preg Test, Ur: NEGATIVE

## 2023-07-09 MED ORDER — METRONIDAZOLE 500 MG PO TABS
500.0000 mg | ORAL_TABLET | Freq: Two times a day (BID) | ORAL | Status: DC
  Administered 2023-07-09: 500 mg via ORAL
  Filled 2023-07-09: qty 1

## 2023-07-09 MED ORDER — SODIUM CHLORIDE 0.9 % IV BOLUS
1000.0000 mL | Freq: Once | INTRAVENOUS | Status: AC
  Administered 2023-07-09: 1000 mL via INTRAVENOUS

## 2023-07-09 MED ORDER — CIPROFLOXACIN HCL 500 MG PO TABS
500.0000 mg | ORAL_TABLET | Freq: Two times a day (BID) | ORAL | Status: DC
  Administered 2023-07-09: 500 mg via ORAL
  Filled 2023-07-09: qty 1

## 2023-07-09 MED ORDER — ONDANSETRON 4 MG PO TBDP: 4.0000 mg | ORAL_TABLET | Freq: Three times a day (TID) | ORAL | 0 refills | Status: AC | PRN

## 2023-07-09 MED ORDER — HALOPERIDOL LACTATE 5 MG/ML IJ SOLN
5.0000 mg | Freq: Once | INTRAMUSCULAR | Status: AC
  Administered 2023-07-09: 5 mg via INTRAVENOUS
  Filled 2023-07-09: qty 1

## 2023-07-09 MED ORDER — METRONIDAZOLE 500 MG PO TABS: 500.0000 mg | ORAL_TABLET | Freq: Three times a day (TID) | ORAL | 0 refills | Status: AC

## 2023-07-09 MED ORDER — CIPROFLOXACIN HCL 500 MG PO TABS: 500.0000 mg | ORAL_TABLET | Freq: Two times a day (BID) | ORAL | 0 refills | Status: AC

## 2023-07-09 MED ORDER — IOHEXOL 300 MG/ML  SOLN
100.0000 mL | Freq: Once | INTRAMUSCULAR | Status: AC | PRN
  Administered 2023-07-09: 100 mL via INTRAVENOUS

## 2023-07-09 MED ORDER — OXYCODONE-ACETAMINOPHEN 5-325 MG PO TABS: 1.0000 | ORAL_TABLET | ORAL | 0 refills | Status: AC | PRN

## 2023-07-09 NOTE — ED Provider Notes (Signed)
Patient received in signout from Dr. Derrill Kay pending follow-up CT imaging.  CT imaging with evidence of mild colitis.  Patient tolerating p.o.  Given amount of pain and discomfort she is having will cover with antibiotics.  Discussed tricked return precautions.  Patient does appear stable and appropriate for outpatient follow-up.   Willy Eddy, MD 07/09/23 1036

## 2023-07-09 NOTE — ED Provider Notes (Signed)
[facility] Provider Note    Event Date/Time   First MD Initiated Contact with Patient 07/09/23 [phone]     (approximate)   History   Abdominal Pain   HPI  Alexis Woodard is a 42 y.o. female who presents to the emergency department today because of concerns for abdominal pain.  Located in the suprapubic area that started roughly 3 hours prior to presentation.  At baseline it is a cramping type pain but she has had intermittent episodes where the pain has been sharp.  The patient denies similar pain in the past.  She did have some associated nausea and vomiting.  She denies any recent change in bowel movements. Denies any fevers. No unusual ingestions.     Physical Exam   Triage Vital Signs: ED Triage Vitals  Encounter Vitals Group     BP 07/09/23 0155 134/88     Systolic BP Percentile --      Diastolic BP Percentile --      Pulse Rate 07/09/23 0155 92     Resp 07/09/23 0155 17     Temp 07/09/23 0155 (!) 97.5 F (36.4 C)     Temp Source 07/09/23 0155 Oral     SpO2 07/09/23 0155 100 %     Weight 07/09/23 0156 220 lb (99.8 kg)     Height 07/09/23 0156 5\' 7"  (1.702 m)     Head Circumference --      Peak Flow --      Pain Score --      Pain Loc --      Pain Education --      Exclude from Growth Chart --     Most recent vital signs: Vitals:   07/09/23 0155  BP: 134/88  Pulse: 92  Resp: 17  Temp: (!) 97.5 F (36.4 C)  SpO2: 100%   General: Awake, alert, oriented. CV:  Good peripheral perfusion. Regular rate and rhythm. Resp:  Normal effort. Lungs clear. Abd:  No distention. Tender to palpation in suprapubic and left abdomen.   ED Results / Procedures / Treatments   Labs (all labs ordered are listed, but only abnormal results are displayed) Labs Reviewed  COMPREHENSIVE METABOLIC PANEL - Abnormal; Notable for the following components:      Result Value   Sodium 132 (*)    CO2 20 (*)    Glucose, Bld 162 (*)    AST 62 (*)    ALT 107 (*)     All other components within normal limits  CBC - Abnormal; Notable for the following components:   WBC 13.8 (*)    RBC 5.18 (*)    Hemoglobin 15.2 (*)    All other components within normal limits  URINALYSIS, ROUTINE W REFLEX MICROSCOPIC - Abnormal; Notable for the following components:   Color, Urine AMBER (*)    APPearance HAZY (*)    Leukocytes,Ua SMALL (*)    Bacteria, UA FEW (*)    All other components within normal limits  LIPASE, BLOOD  POC URINE PREG, ED     EKG  None   RADIOLOGY CT pending at time of sign out   PROCEDURES:  Critical Care performed: No   MEDICATIONS ORDERED IN ED: Medications - No data to display   IMPRESSION / MDM / ASSESSMENT AND PLAN / ED COURSE  I reviewed the triage vital signs and the nursing notes.  Differential diagnosis includes, but is not limited to, UTI, diverticulitis, appendicitis, kidney stone  Patient's presentation is most consistent with acute presentation with potential threat to life or bodily function.   The patient is on the cardiac monitor to evaluate for evidence of arrhythmia and/or significant heart rate changes.  Patient presented to the emergency department today because of concerns for lower abdominal pain.  On exam she does have some tenderness in the suprapubic and the left side of her abdomen.  Blood work with mild leukocytosis.  Urine with signs concerning for possible infection although there were squamous epithelial cells present.  Will obtain CT scan given concern for possible diverticulitis or kidney stone given left-sided abdominal pain.  Will also evaluate for possible ascending urinary tract infection. Awaiting CT at time of sign out.      FINAL CLINICAL IMPRESSION(S) / ED DIAGNOSES   Final diagnoses:  Abdominal pain, unspecified abdominal location     Note:  This document was prepared using Dragon voice recognition software and may include unintentional  dictation errors.    Phineas Semen, MD 07/09/23 [phone]

## 2023-07-09 NOTE — ED Triage Notes (Signed)
Pt states she for the past three hours she has had cramping in her abd followed by a sharp stabbing pain that last about 30 seconds then back to cramping. Pt states one episode of diarrhea. Endorses nausea also.

## 2023-09-21 IMAGING — CT CT CHEST HIGH RESOLUTION
2 of 9 series · 13 of 36 positions shown, 16 images · non-contrast
Comparison: CT chest angiogram, 01/13/2022

CLINICAL DATA: Shortness of breath, severe asthma



[Series 4: thorax 2.00 cor · coronal · 0.59mm/px · 1 of 154 slices shown]
[im 77/154  lung]
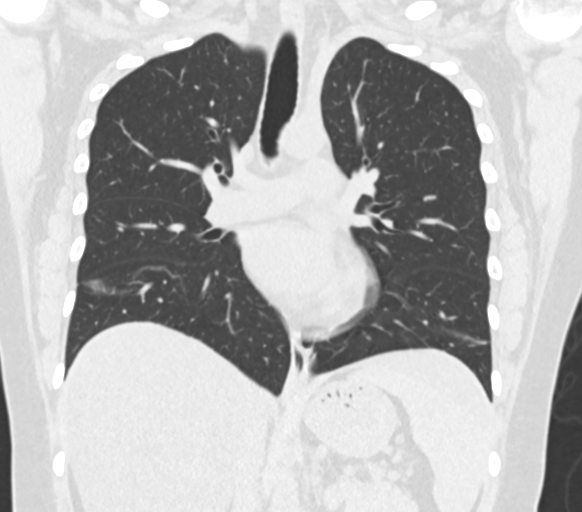

[Series 14: high res (id) thorax 1.00 ax · axial · 0.60mm/px · z∈[-1175,-921]mm · 12 of 302 slices shown, 15 images]
[im 24/302  mediastinal]
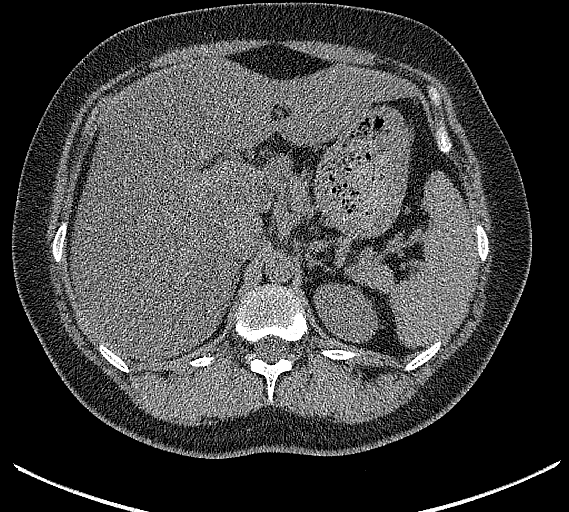
[im 24/302  lung]
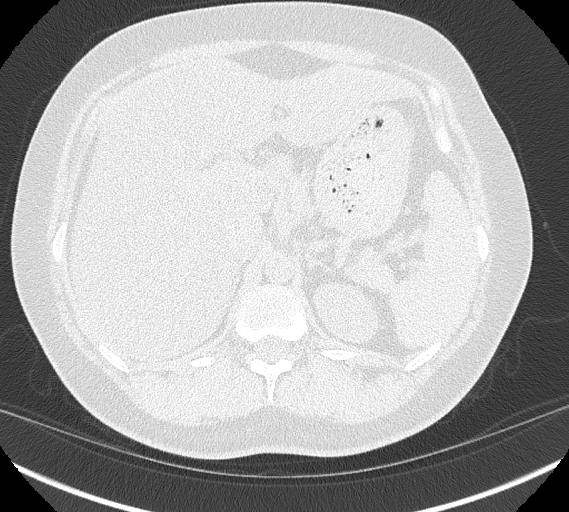
[im 47/302  lung]
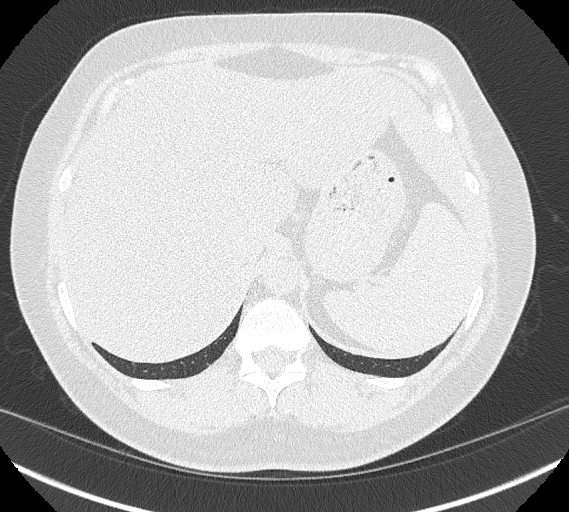
[im 70/302  lung]
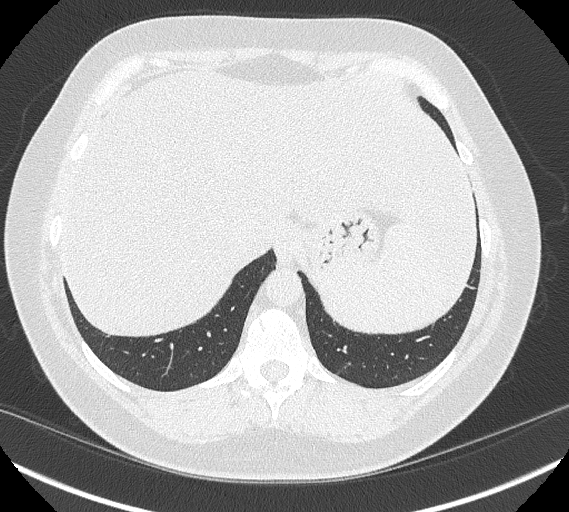
[im 93/302  lung]
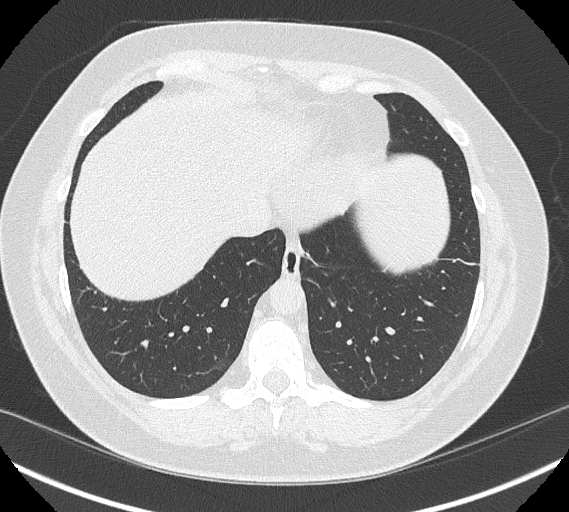
[im 116/302  mediastinal]
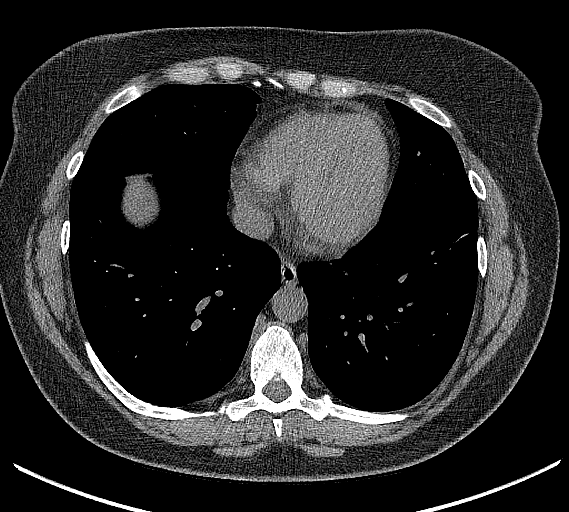
[im 116/302  lung]
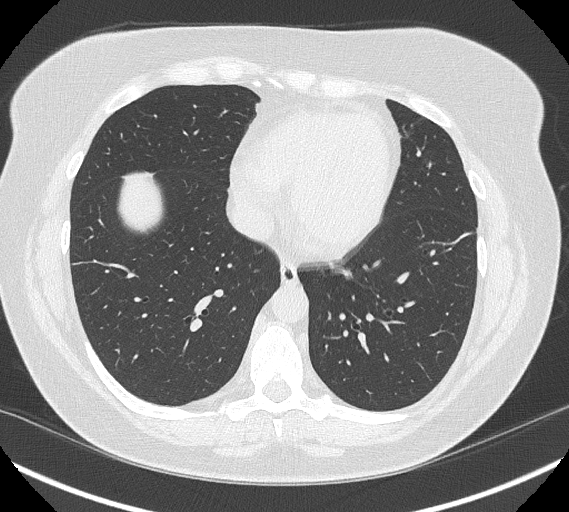
[im 139/302  lung]
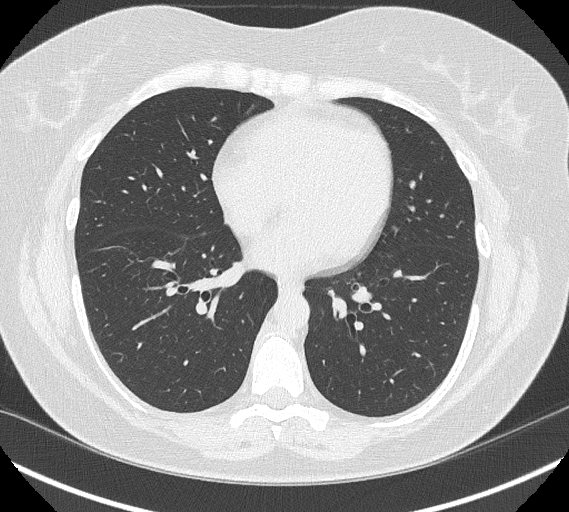
[im 163/302  lung]
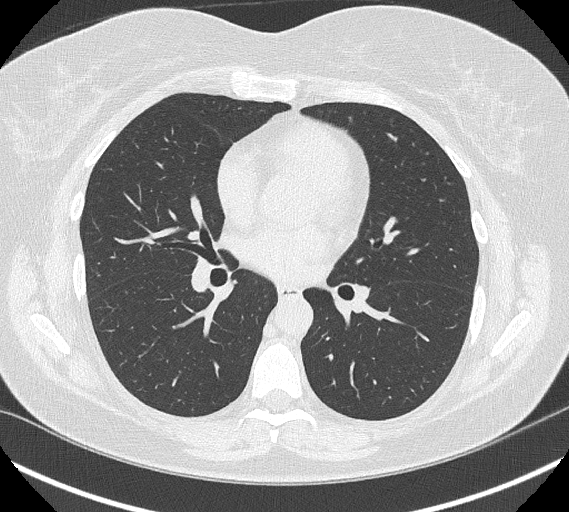
[im 186/302  lung]
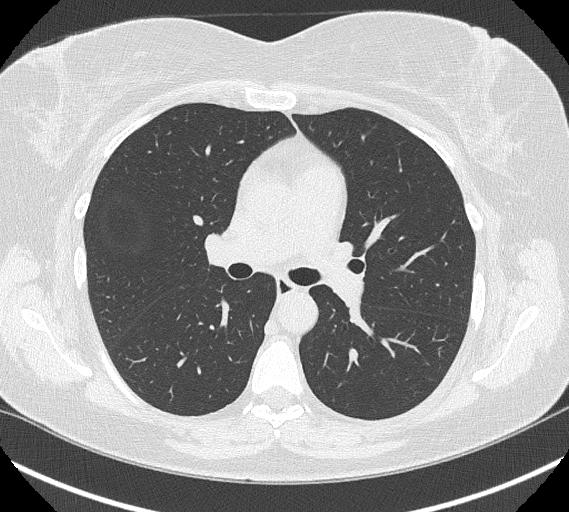
[im 209/302  mediastinal]
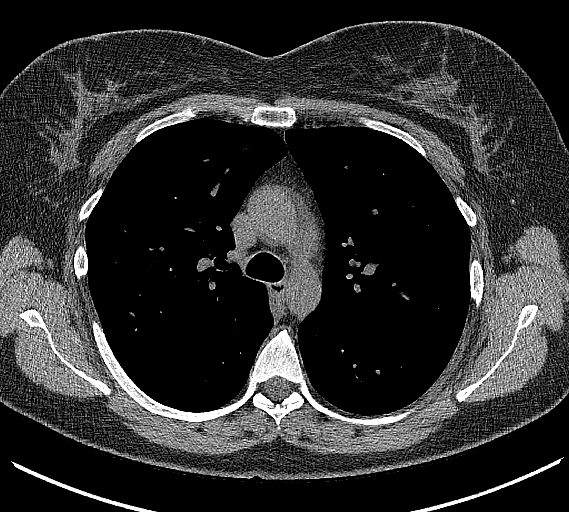
[im 209/302  lung]
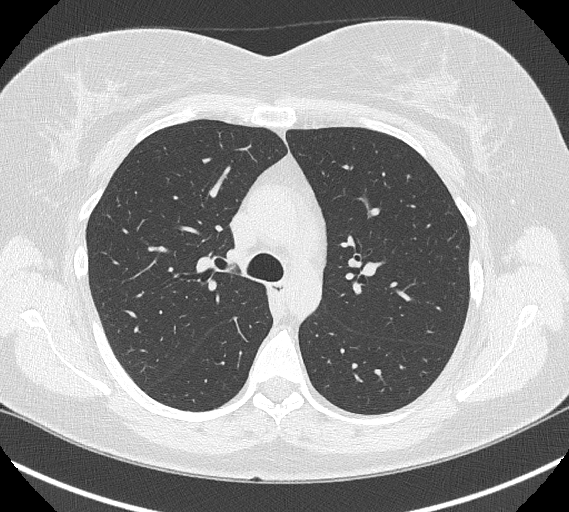
[im 232/302  lung]
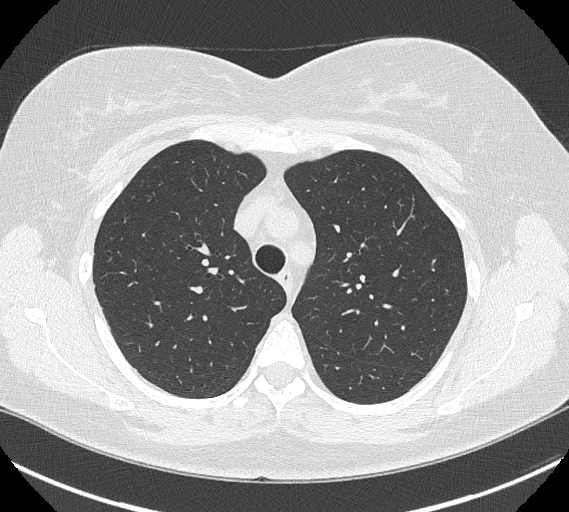
[im 255/302  lung]
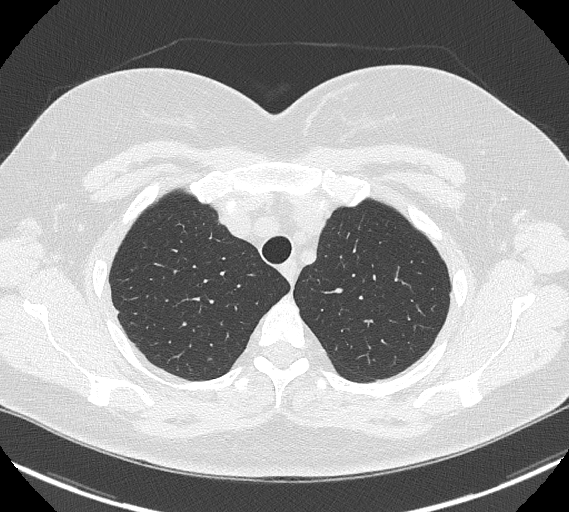
[im 278/302  lung]
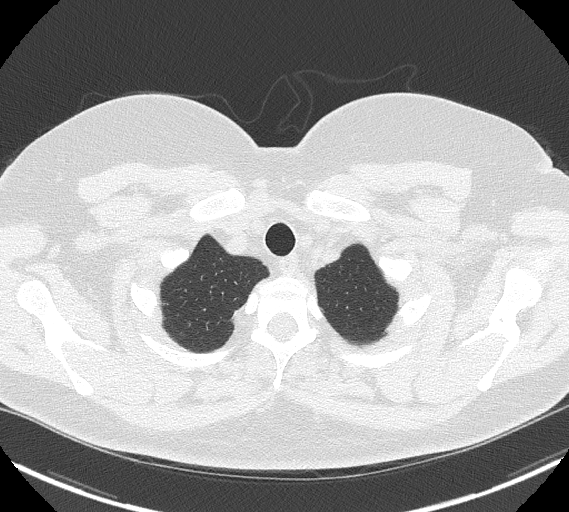

[13 of 36 positions shown; findings below may reference images not displayed]

FINDINGS: Cardiovascular: No significant vascular findings. Normal heart size.
No pericardial effusion.

Mediastinum/Nodes: No enlarged mediastinal, hilar, or axillary lymph
nodes. Thyroid gland, trachea, and esophagus demonstrate no
significant findings.

Lungs/Pleura: Minimal, bandlike scarring of the bilateral lung bases
(series 8, image 94). Lobular air trapping on expiratory phase
imaging. No pleural effusion or pneumothorax.

Upper Abdomen: No acute abnormality.  Hepatic steatosis.

Musculoskeletal: No chest wall abnormality. No suspicious osseous
lesions identified.
IMPRESSION: 1. No evidence of fibrotic interstitial lung disease. Minimal, bland
scarring of the bilateral lung bases.
2. Lobular air trapping on expiratory phase imaging, consistent with
small airways disease.
3. Hepatic steatosis.

## 2023-11-08 ENCOUNTER — Other Ambulatory Visit: Payer: Self-pay | Admitting: Obstetrics and Gynecology

## 2023-11-08 DIAGNOSIS — Z1231 Encounter for screening mammogram for malignant neoplasm of breast: Secondary | ICD-10-CM

## 2023-11-23 ENCOUNTER — Ambulatory Visit
Admission: RE | Admit: 2023-11-23 | Discharge: 2023-11-23 | Disposition: A | Discharge status: Home / Self Care | Payer: Federal, State, Local not specified - PPO | Source: Ambulatory Visit | Attending: Obstetrics and Gynecology | Admitting: Obstetrics and Gynecology

## 2023-11-23 DIAGNOSIS — Z1231 Encounter for screening mammogram for malignant neoplasm of breast: Secondary | ICD-10-CM | POA: Insufficient documentation

## 2024-05-18 ENCOUNTER — Emergency Department
Admission: EM | Admit: 2024-05-18 | Discharge: 2024-05-18 | Disposition: A | Discharge status: Home / Self Care | Attending: Emergency Medicine | Admitting: Emergency Medicine

## 2024-05-18 ENCOUNTER — Other Ambulatory Visit: Payer: Self-pay

## 2024-05-18 DIAGNOSIS — K625 Hemorrhage of anus and rectum: Secondary | ICD-10-CM | POA: Diagnosis present

## 2024-05-18 DIAGNOSIS — M545 Low back pain, unspecified: Secondary | ICD-10-CM | POA: Diagnosis not present

## 2024-05-18 DIAGNOSIS — K644 Residual hemorrhoidal skin tags: Secondary | ICD-10-CM | POA: Insufficient documentation

## 2024-05-18 DIAGNOSIS — R197 Diarrhea, unspecified: Secondary | ICD-10-CM | POA: Insufficient documentation

## 2024-05-18 DIAGNOSIS — J45909 Unspecified asthma, uncomplicated: Secondary | ICD-10-CM | POA: Diagnosis not present

## 2024-05-18 LAB — GASTROINTESTINAL PANEL BY PCR, STOOL (REPLACES STOOL CULTURE)

## 2024-05-18 LAB — TYPE AND SCREEN
ABO/RH(D): A POS
Antibody Screen: NEGATIVE

## 2024-05-18 LAB — COMPREHENSIVE METABOLIC PANEL WITH GFR
ALT: 52 U/L — ABNORMAL HIGH (ref 0–44)
AST: 32 U/L (ref 15–41)
Albumin: 4 g/dL (ref 3.5–5.0)
Alkaline Phosphatase: 78 U/L (ref 38–126)
Anion gap: 11 (ref 5–15)
BUN: 8 mg/dL (ref 6–20)
CO2: 24 mmol/L (ref 22–32)
Calcium: 9.1 mg/dL (ref 8.9–10.3)
Chloride: 104 mmol/L (ref 98–111)
Creatinine, Ser: 0.72 mg/dL (ref 0.44–1.00)
GFR, Estimated: >60 mL/min (ref >60)
Glucose, Bld: 111 mg/dL — ABNORMAL HIGH (ref 70–99)
Potassium: 3.4 mmol/L — ABNORMAL LOW (ref 3.5–5.1)
Sodium: 139 mmol/L (ref 135–145)
Total Bilirubin: 0.9 mg/dL (ref 0.0–1.2)
Total Protein: 7.2 g/dL (ref 6.5–8.1)

## 2024-05-18 LAB — HCG, QUANTITATIVE, PREGNANCY: hCG, Beta Chain, Quant, S: 1 m[IU]/mL (ref <5)

## 2024-05-18 LAB — CBC
HCT: 44.7 % (ref 36.0–46.0)
Hemoglobin: 14.9 g/dL (ref 12.0–15.0)
MCH: 29.4 pg (ref 26.0–34.0)
MCHC: 33.3 g/dL (ref 30.0–36.0)
MCV: 88.3 fL (ref 80.0–100.0)
Platelets: 358 10*3/uL (ref 150–400)
RBC: 5.06 MIL/uL (ref 3.87–5.11)
RDW: 12.4 % (ref 11.5–15.5)
WBC: 11.3 10*3/uL — ABNORMAL HIGH (ref 4.0–10.5)
nRBC: 0 % (ref 0.0–0.2)

## 2024-05-18 LAB — C DIFFICILE QUICK SCREEN W PCR REFLEX
C Diff antigen: NEGATIVE
C Diff interpretation: NOT DETECTED
C Diff toxin: NEGATIVE

## 2024-05-18 LAB — LIPASE, BLOOD: Lipase: 37 U/L (ref 11–51)

## 2024-05-18 MED ORDER — BENEFIBER DRINK MIX PO PACK: 1.0000 | PACK | Freq: Every day | ORAL | 0 refills | Status: AC

## 2024-05-18 NOTE — ED Triage Notes (Signed)
 Pt comes in via pov with complaint of rectal bleeding since about 11 pm last night. Pt states that it started with really bad stomach pains, and then began having diarrhea, and now it is just bright red blood. Pt complains of abdominal cramping and lower back pain. Pt is alert and oriented x4 with no signs of acute distress at this time.

## 2024-05-18 NOTE — Discharge Instructions (Signed)
 Please make sure to follow-up with the gastroenterologist, you can follow-up with your original gastroenterologist or call the number above to set up a follow-up appointment.

## 2024-05-18 NOTE — ED Provider Notes (Signed)
 [facility] Provider Note    Event Date/Time   First MD Initiated Contact with Patient 05/18/24 [id number]     (approximate)   History   Rectal Bleeding   HPI  Alexis Woodard is a 43 y.o. female with history of asthma, presenting with bright red blood per rectum.  Patient states that she had stomach cramping, diarrhea a couple days ago, states that she then noted blood in her stool.  Bleeding had occurred after around a nausea vomiting.  Does have history of hemorrhoids, mom's thinks that patient may have ruptured hemorrhoid and that is why she is bleeding.  Does have family history of colon cancer, paternal grandmother had passed away from colon cancer in her 63s.  Patient had a colonoscopy several years ago that was negative.  Has not followed up with GI.  Denies any vaginal bleeding, has an IUD in place.  Denies any hematuria or dysuria.  No abdominal pain at this time, no fevers.  She denies any personal history of malignancy or IBD or IBS.  Denies any unexpected weight loss.  No lightheadedness or syncopal episodes.  She denies any recent travel, no camping trips, no other sick contacts.  No recent antibiotic use.  Independent history obtained from mom as above.  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     Physical Exam   Triage Vital Signs: ED Triage Vitals  Encounter Vitals Group     BP 05/18/24 0936 (!) 146/95     Systolic BP Percentile --      Diastolic BP Percentile --      Pulse Rate 05/18/24 0936 84     Resp 05/18/24 0936 18     Temp 05/18/24 0936 (!) 97.5 F (36.4 C)     Temp Source 05/18/24 0936 Oral     SpO2 05/18/24 0936 99 %     Weight 05/18/24 0924 200 lb (90.7 kg)     Height 05/18/24 0924 5\' 7"  (1.702 m)     Head Circumference --      Peak Flow --      Pain Score 05/18/24 0924 1     Pain Loc --      Pain Education --       Exclude from Growth Chart --     Most recent vital signs: Vitals:   05/18/24 1033 05/18/24 1100  BP:  (!) 117/105  Pulse:  65  Resp:    Temp:    SpO2: 100% 100%     General: Awake, no distress.  CV:  Good peripheral perfusion.  Resp:  Normal effort.  Abd:  No distention.  Soft nontender Other:  Rectal exam was done with chaperone, small external hemorrhoid, actively bleeding, not thrombosed.  No stool in rectal vault, no bloody mucosa.   ED Results / Procedures / Treatments   Labs (all labs ordered are listed, but only abnormal results are displayed) Labs Reviewed  COMPREHENSIVE METABOLIC PANEL WITH GFR - Abnormal; Notable for the following components:      Result Value   Potassium 3.4 (*)    Glucose, Bld 111 (*)    ALT 52 (*)    All other components within normal limits  CBC - Abnormal; Notable for the following components:   WBC 11.3 (*)    All other components within normal limits  GASTROINTESTINAL PANEL BY PCR, STOOL (REPLACES STOOL  CULTURE)  C DIFFICILE QUICK SCREEN W PCR REFLEX    LIPASE, BLOOD  HCG, QUANTITATIVE, PREGNANCY  POC URINE PREG, ED  TYPE AND SCREEN    PROCEDURES:  Critical Care performed: No  Procedures   MEDICATIONS ORDERED IN ED: Medications - No data to display   IMPRESSION / MDM / ASSESSMENT AND PLAN / ED COURSE  I reviewed the triage vital signs and the nursing notes.                              Differential diagnosis includes, but is not limited to, hemorrhoid, anemia, gastroenteritis, no abdominal pain on exam, she is well-appearing, imaging not indicated at this time.  Also considered malignancy given family history, but she has no unexpected weight loss, bleeding had occurred for a day.  Will get labs, shared decision-making done with patient and mom and they are agreeable plan for outpatient follow-up with gastroenterology.  Will give them a number to call for follow-up.  Patient's presentation is most consistent with  acute presentation with potential threat to life or bodily function.  Independent interpretation of labs and imaging below.  GI PCR and C. difficile are negative, electrolytes not severely deranged, patient is safe for outpatient management.  Shared decision making to patient and they are agreeable plan for discharge, will give him a number to call for [facility] or she can follow-up with her original GI doc.  Considered but no indication for further workup or inpatient admission at this time, she is safe for outpatient management.  Will discharge with strict return precautions.  The patient is on the cardiac monitor to evaluate for evidence of arrhythmia and/or significant heart rate changes.   Clinical Course as of 05/18/24 1416  Fri May 18, 2024  1144 Independent review of labs, hCG is negative, electrolytes not severely deranged, ALT is mildly elevated, lipase is normal, mild leukocytosis, H&H is stable. [TT]  1337 C Difficile Quick Screen w PCR reflex neg [TT]  1415 Gastrointestinal Panel by PCR , Stool neg [TT]    Clinical Course User Index [TT] Drenda Gentle, Richard Champion, MD     FINAL CLINICAL IMPRESSION(S) / ED DIAGNOSES   Final diagnoses:  Rectal bleeding  Diarrhea, unspecified type     Rx / DC Orders   ED Discharge Orders          Ordered    Wheat Dextrin (BENEFIBER DRINK MIX) PACK  Daily        05/18/24 1415             Note:  This document was prepared using Dragon voice recognition software and may include unintentional dictation errors.    Shane Darling, MD 05/18/24 [phone]

## 2025-01-10 ENCOUNTER — Other Ambulatory Visit: Payer: Self-pay | Admitting: Obstetrics and Gynecology

## 2025-01-10 DIAGNOSIS — Z1231 Encounter for screening mammogram for malignant neoplasm of breast: Secondary | ICD-10-CM

## 2025-01-23 ENCOUNTER — Ambulatory Visit
Admission: RE | Admit: 2025-01-23 | Discharge: 2025-01-23 | Disposition: A | Discharge status: Home / Self Care | Source: Ambulatory Visit | Attending: Obstetrics and Gynecology | Admitting: Obstetrics and Gynecology

## 2025-01-23 DIAGNOSIS — Z1231 Encounter for screening mammogram for malignant neoplasm of breast: Secondary | ICD-10-CM | POA: Insufficient documentation
